# Patient Record
Sex: Male | Born: 1985 | Race: Black or African American | Hispanic: No | Marital: Single | Smoking: Former smoker
Health system: Southern US, Community
[De-identification: ages and names within clinical notes are randomized; demographics above are authoritative.]

## PROBLEM LIST (undated history)

## (undated) DIAGNOSIS — Z21 Asymptomatic human immunodeficiency virus [HIV] infection status: Secondary | ICD-10-CM

## (undated) DIAGNOSIS — L0211 Cutaneous abscess of neck: Secondary | ICD-10-CM

## (undated) DIAGNOSIS — B20 Human immunodeficiency virus [HIV] disease: Secondary | ICD-10-CM

## (undated) HISTORY — PX: HERNIA REPAIR: SHX51

---
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
## 2013-08-05 ENCOUNTER — Encounter (HOSPITAL_BASED_OUTPATIENT_CLINIC_OR_DEPARTMENT_OTHER): Payer: Self-pay | Admitting: Emergency Medicine

## 2013-08-05 ENCOUNTER — Emergency Department (HOSPITAL_BASED_OUTPATIENT_CLINIC_OR_DEPARTMENT_OTHER)
Admission: EM | Admit: 2013-08-05 | Discharge: 2013-08-05 | Disposition: A | Discharge status: Home / Self Care | Payer: Self-pay | Attending: Emergency Medicine | Admitting: Emergency Medicine

## 2013-08-05 DIAGNOSIS — Z8719 Personal history of other diseases of the digestive system: Secondary | ICD-10-CM | POA: Insufficient documentation

## 2013-08-05 DIAGNOSIS — F411 Generalized anxiety disorder: Secondary | ICD-10-CM | POA: Insufficient documentation

## 2013-08-05 DIAGNOSIS — F17200 Nicotine dependence, unspecified, uncomplicated: Secondary | ICD-10-CM | POA: Insufficient documentation

## 2013-08-05 DIAGNOSIS — F1010 Alcohol abuse, uncomplicated: Secondary | ICD-10-CM | POA: Insufficient documentation

## 2013-08-05 DIAGNOSIS — R066 Hiccough: Secondary | ICD-10-CM | POA: Insufficient documentation

## 2013-08-05 DIAGNOSIS — F10929 Alcohol use, unspecified with intoxication, unspecified: Secondary | ICD-10-CM

## 2013-08-05 DIAGNOSIS — Z880 Allergy status to penicillin: Secondary | ICD-10-CM | POA: Insufficient documentation

## 2013-08-05 LAB — ETHANOL: Alcohol, Ethyl (B): 271 mg/dL — ABNORMAL HIGH (ref 0–11)

## 2013-08-05 MED ORDER — METOCLOPRAMIDE HCL 5 MG/ML IJ SOLN
10.0000 mg | Freq: Once | INTRAMUSCULAR | Status: AC
  Administered 2013-08-05: 10 mg via INTRAVENOUS
  Filled 2013-08-05: qty 2

## 2013-08-05 MED ORDER — SODIUM CHLORIDE 0.9 % IV SOLN
25.0000 mg | Freq: Once | INTRAVENOUS | Status: DC
  Filled 2013-08-05: qty 1

## 2013-08-05 MED ORDER — SODIUM CHLORIDE 0.9 % IV BOLUS (SEPSIS)
1000.0000 mL | Freq: Once | INTRAVENOUS | Status: AC
  Administered 2013-08-05: 1000 mL via INTRAVENOUS

## 2013-08-05 NOTE — Discharge Instructions (Signed)
Alcohol Intoxication °Alcohol intoxication occurs when the amount of alcohol that a person has consumed impairs his or her ability to mentally and physically function. Alcohol directly impairs the normal chemical activity of the brain. Drinking large amounts of alcohol can lead to changes in mental function and behavior, and it can cause many physical effects that can be harmful.  °Alcohol intoxication can range in severity from mild to very severe. Various factors can affect the level of intoxication that occurs, such as the person's age, gender, weight, frequency of alcohol consumption, and the presence of other medical conditions (such as diabetes, seizures, or heart conditions). Dangerous levels of alcohol intoxication may occur when people drink large amounts of alcohol in a short period (binge drinking). Alcohol can also be especially dangerous when combined with certain prescription medicines or "recreational" drugs. °SIGNS AND SYMPTOMS °Some common signs and symptoms of mild alcohol intoxication include: °· Loss of coordination. °· Changes in mood and behavior. °· Impaired judgment. °· Slurred speech. °As alcohol intoxication progresses to more severe levels, other signs and symptoms will appear. These may include: °· Vomiting. °· Confusion and impaired memory. °· Slowed breathing. °· Seizures. °· Loss of consciousness. °DIAGNOSIS  °Your health care provider will take a medical history and perform a physical exam. You will be asked about the amount and type of alcohol you have consumed. Blood tests will be done to measure the concentration of alcohol in your blood. In many places, your blood alcohol level must be lower than 80 mg/dL (0.08%) to legally drive. However, many dangerous effects of alcohol can occur at much lower levels.  °TREATMENT  °People with alcohol intoxication often do not require treatment. Most of the effects of alcohol intoxication are temporary, and they go away as the alcohol naturally  leaves the body. Your health care provider will monitor your condition until you are stable enough to go home. Fluids are sometimes given through an IV access tube to help prevent dehydration.  °HOME CARE INSTRUCTIONS °· Do not drive after drinking alcohol. °· Stay hydrated. Drink enough water and fluids to keep your urine clear or pale yellow. Avoid caffeine.   °· Only take over-the-counter or prescription medicines as directed by your health care provider.   °SEEK MEDICAL CARE IF:  °· You have persistent vomiting.   °· You do not feel better after a few days. °· You have frequent alcohol intoxication. Your health care provider can help determine if you should see a substance use treatment counselor. °SEEK IMMEDIATE MEDICAL CARE IF:  °· You become shaky or tremble when you try to stop drinking.   °· You shake uncontrollably (seizure).   °· You throw up (vomit) blood. This may be bright red or may look like black coffee grounds.   °· You have blood in your stool. This may be bright red or may appear as a black, tarry, bad smelling stool.   °· You become lightheaded or faint.   °MAKE SURE YOU:  °· Understand these instructions. °· Will watch your condition. °· Will get help right away if you are not doing well or get worse. °Document Released: 04/18/2005 Document Revised: 03/11/2013 Document Reviewed: 12/12/2012 °[employer]® Patient Information ©2014 [employer]. ° °

## 2013-08-05 NOTE — ED Provider Notes (Signed)
CSN: [id number]     Arrival date & time 08/05/13  0156 History   First MD Initiated Contact with Patient 08/05/13 [phone]     Chief Complaint  Patient presents with  . Hiccups   (Consider location/radiation/quality/duration/timing/severity/associated sxs/prior Treatment) HPI Is a 28 year old male with a history of intractable hiccups. He is here with pickups for the past 2-3 hours. He admits to drinking alcohol earlier. The hiccups cause him to have pain in the center of his chest. He has been admitted to the hospital for hiccups in the past. He has not tried any specific remedies on this occasion. He admits he gets very anxious when he has an episode of hiccups. He describes his symptoms as moderate to severe.  History reviewed. No pertinent past medical history. Past Surgical History  Procedure Laterality Date  . Hernia repair     History reviewed. No pertinent family history. History  Substance Use Topics  . Smoking status: Current Every Day Smoker  . Smokeless tobacco: Not on file  . Alcohol Use: Yes    Review of Systems  All other systems reviewed and are negative.    Allergies  Penicillins  Home Medications  No current outpatient prescriptions on file. BP 143/85  Pulse 111  Resp 20  Ht 6' (1.829 m)  Wt 225 lb (102.059 kg)  BMI 30.51 kg/m2  SpO2 100%  Physical Exam General: Well-developed, well-nourished male in no acute distress; appearance consistent with age of record HENT: normocephalic; atraumatic; breath smells of alcohol Eyes: pupils equal, round and reactive to light; extraocular muscles intact Neck: supple Heart: regular rate and rhythm Lungs: clear to auscultation bilaterally; frequent hiccups Chest: Nontender Abdomen: soft; nondistended; nontender; bowel sounds present Extremities: No deformity; full range of motion; pulses normal Neurologic: Awake, alert and oriented; motor function intact in all extremities and symmetric; no facial droop Skin: Warm  and dry Psychiatric: Anxious    ED Course  Procedures (including critical care time)    MDM   Nursing notes and vitals signs, including pulse oximetry, reviewed.  Summary of this visit's results, reviewed by myself:  Labs:  Results for orders placed during the hospital encounter of 08/05/13 (from the past 24 hour(s))  ETHANOL     Status: Abnormal   Collection Time    08/05/13  2:16 AM      Result Value Range   Alcohol, Ethyl (B) 271 (*) 0 - 11 mg/dL   5:623:00 AM Patient sleeping comfortably without hiccups after 10 mg of Reglan IV. He was also given IV fluid bolus.    Carlisle BeersJohn L Jodie Leiner, MD 08/05/13 0300

## 2013-08-05 NOTE — ED Notes (Signed)
C/o hiccups x 2 hours, +ETOH.

## 2014-09-20 ENCOUNTER — Telehealth: Payer: Self-pay

## 2014-09-20 NOTE — Telephone Encounter (Signed)
Patient contacted regarding new intake appointment. Date and time given. Information given regarding documents needed to qualify for financial eligibility.  Dennise Raabe K Denyce Harr, RN  

## 2014-09-27 ENCOUNTER — Ambulatory Visit: Payer: Self-pay

## 2015-02-21 DIAGNOSIS — L0211 Cutaneous abscess of neck: Secondary | ICD-10-CM

## 2015-02-21 HISTORY — DX: Cutaneous abscess of neck: L02.11

## 2015-02-28 ENCOUNTER — Emergency Department (HOSPITAL_COMMUNITY): Payer: Self-pay

## 2015-02-28 ENCOUNTER — Inpatient Hospital Stay (HOSPITAL_COMMUNITY)
Admission: EM | Admit: 2015-02-28 | Discharge: 2015-03-02 | DRG: 603 | Disposition: A | Discharge status: Home / Self Care | Payer: Self-pay | Attending: Oncology | Admitting: Oncology

## 2015-02-28 ENCOUNTER — Encounter (HOSPITAL_COMMUNITY): Payer: Self-pay | Admitting: Emergency Medicine

## 2015-02-28 DIAGNOSIS — Z880 Allergy status to penicillin: Secondary | ICD-10-CM

## 2015-02-28 DIAGNOSIS — M542 Cervicalgia: Secondary | ICD-10-CM | POA: Insufficient documentation

## 2015-02-28 DIAGNOSIS — Z21 Asymptomatic human immunodeficiency virus [HIV] infection status: Secondary | ICD-10-CM | POA: Diagnosis present

## 2015-02-28 DIAGNOSIS — Z720 Tobacco use: Secondary | ICD-10-CM | POA: Diagnosis present

## 2015-02-28 DIAGNOSIS — L0211 Cutaneous abscess of neck: Principal | ICD-10-CM | POA: Diagnosis present

## 2015-02-28 DIAGNOSIS — K122 Cellulitis and abscess of mouth: Secondary | ICD-10-CM

## 2015-02-28 DIAGNOSIS — B20 Human immunodeficiency virus [HIV] disease: Secondary | ICD-10-CM | POA: Diagnosis present

## 2015-02-28 DIAGNOSIS — F17210 Nicotine dependence, cigarettes, uncomplicated: Secondary | ICD-10-CM | POA: Diagnosis present

## 2015-02-28 HISTORY — DX: Cutaneous abscess of neck: L02.11

## 2015-02-28 LAB — BASIC METABOLIC PANEL
ANION GAP: 11 (ref 5–15)
BUN: 7 mg/dL (ref 6–20)
CHLORIDE: 106 mmol/L (ref 101–111)
CO2: 23 mmol/L (ref 22–32)
Calcium: 8.4 mg/dL — ABNORMAL LOW (ref 8.9–10.3)
Creatinine, Ser: 0.81 mg/dL (ref 0.61–1.24)
GFR calc Af Amer: >60 mL/min (ref >60)
GFR calc non Af Amer: >60 mL/min (ref >60)
GLUCOSE: 86 mg/dL (ref 65–99)
POTASSIUM: 3.7 mmol/L (ref 3.5–5.1)
SODIUM: 140 mmol/L (ref 135–145)

## 2015-02-28 LAB — I-STAT CHEM 8, ED
BUN: 8 mg/dL (ref 6–20)
Calcium, Ion: 1.12 mmol/L (ref 1.12–1.23)
Chloride: 106 mmol/L (ref 101–111)
Creatinine, Ser: 1 mg/dL (ref 0.61–1.24)
Glucose, Bld: 89 mg/dL (ref 65–99)
HEMATOCRIT: 44 % (ref 39.0–52.0)
Hemoglobin: 15 g/dL (ref 13.0–17.0)
POTASSIUM: 3.8 mmol/L (ref 3.5–5.1)
SODIUM: 142 mmol/L (ref 135–145)
TCO2: 21 mmol/L (ref 0–100)

## 2015-02-28 LAB — CBC
HCT: 41.6 % (ref 39.0–52.0)
Hemoglobin: 14.3 g/dL (ref 13.0–17.0)
MCH: 32.4 pg (ref 26.0–34.0)
MCHC: 34.4 g/dL (ref 30.0–36.0)
MCV: 94.1 fL (ref 78.0–100.0)
Platelets: 217 10*3/uL (ref 150–400)
RBC: 4.42 MIL/uL (ref 4.22–5.81)
RDW: 14.5 % (ref 11.5–15.5)
WBC: 9.9 10*3/uL (ref 4.0–10.5)

## 2015-02-28 MED ORDER — ENOXAPARIN SODIUM 40 MG/0.4ML ~~LOC~~ SOLN
40.0000 mg | SUBCUTANEOUS | Status: DC
  Administered 2015-02-28 – 2015-03-02 (×3): 40 mg via SUBCUTANEOUS
  Filled 2015-02-28 (×3): qty 0.4

## 2015-02-28 MED ORDER — IOHEXOL 300 MG/ML  SOLN
75.0000 mL | Freq: Once | INTRAMUSCULAR | Status: AC | PRN
  Administered 2015-02-28: 75 mL via INTRAVENOUS

## 2015-02-28 MED ORDER — HYDROMORPHONE HCL 1 MG/ML IJ SOLN
1.0000 mg | Freq: Once | INTRAMUSCULAR | Status: AC
  Administered 2015-02-28: 1 mg via INTRAVENOUS
  Filled 2015-02-28: qty 1

## 2015-02-28 MED ORDER — MORPHINE SULFATE 2 MG/ML IJ SOLN
1.0000 mg | INTRAMUSCULAR | Status: DC | PRN
  Administered 2015-02-28 – 2015-03-02 (×9): 1 mg via INTRAVENOUS
  Filled 2015-02-28 (×8): qty 1

## 2015-02-28 MED ORDER — SODIUM CHLORIDE 0.9 % IV BOLUS (SEPSIS)
1000.0000 mL | Freq: Once | INTRAVENOUS | Status: AC
  Administered 2015-02-28: 1000 mL via INTRAVENOUS

## 2015-02-28 MED ORDER — CLINDAMYCIN PHOSPHATE 600 MG/50ML IV SOLN
600.0000 mg | Freq: Three times a day (TID) | INTRAVENOUS | Status: DC
  Administered 2015-02-28 – 2015-03-02 (×7): 600 mg via INTRAVENOUS
  Filled 2015-02-28 (×9): qty 50

## 2015-02-28 MED ORDER — CLINDAMYCIN PHOSPHATE 900 MG/50ML IV SOLN
900.0000 mg | Freq: Once | INTRAVENOUS | Status: AC
  Administered 2015-02-28: 900 mg via INTRAVENOUS
  Filled 2015-02-28: qty 50

## 2015-02-28 NOTE — ED Notes (Signed)
Left vm for ENT/Bates to call Dr. Gwendolyn Grant at [phone]

## 2015-02-28 NOTE — ED Provider Notes (Signed)
CSN: [id number]     Arrival date & time 02/28/15  0447 History   First MD Initiated Contact with Patient 02/28/15 0455     Chief Complaint  Patient presents with  . Facial Swelling     (Consider location/radiation/quality/duration/timing/severity/associated sxs/prior Treatment) Patient is a 29 y.o. male presenting with abscess. The history is provided by the patient.  Abscess Location:  Head/neck Head/neck abscess location:  L neck Size:  6 cm Abscess quality: induration and painful   Abscess quality: not weeping   Red streaking: no   Progression:  Worsening Pain details:    Quality:  Aching   Severity:  Moderate   Timing:  Constant   Progression:  Unchanged Chronicity:  New Context: not diabetes and not immunosuppression   Relieved by:  Nothing Exacerbated by: lanced it last night with a lot of pus expressed, abscess is getting bigger now. Associated symptoms: no fever     History reviewed. No pertinent past medical history. Past Surgical History  Procedure Laterality Date  . Hernia repair     History reviewed. No pertinent family history. History  Substance Use Topics  . Smoking status: Current Every Day Smoker -- 1.00 packs/day for 10 years    Types: Cigarettes  . Smokeless tobacco: Not on file  . Alcohol Use: Yes    Review of Systems  Constitutional: Negative for fever and chills.  HENT: Negative for trouble swallowing.   Respiratory: Negative for cough and shortness of breath.   All other systems reviewed and are negative.     Allergies  Penicillins  Home Medications   Prior to Admission medications   Not on File   BP 137/88 mmHg  Pulse 86  Temp(Src) 99.4 F (37.4 C) (Oral)  Resp 9  SpO2 94% Physical Exam  Constitutional: He is oriented to person, place, and time. He appears well-developed and well-nourished. No distress.  HENT:  Head: Normocephalic and atraumatic.  Mouth/Throat: No oropharyngeal exudate.  Eyes: EOM are normal. Pupils are  equal, round, and reactive to light.  Neck: Normal range of motion. Neck supple. No tracheal deviation present.  L submandibular abscess with induration. Large, extends along entire angle of the jaw. No fluctuance.  Submental space is soft.  Cardiovascular: Normal rate and regular rhythm.  Exam reveals no friction rub.   No murmur heard. Pulmonary/Chest: Effort normal and breath sounds normal. No stridor. No respiratory distress. He has no wheezes. He has no rales.  Abdominal: He exhibits no distension. There is no tenderness. There is no rebound.  Musculoskeletal: Normal range of motion. He exhibits no edema.  Lymphadenopathy:    He has cervical adenopathy.  Neurological: He is alert and oriented to person, place, and time.  Skin: He is not diaphoretic.  Nursing note and vitals reviewed.   ED Course  Procedures (including critical care time) Labs Review Labs Reviewed  BASIC METABOLIC PANEL - Abnormal; Notable for the following:    Calcium 8.4 (*)    All other components within normal limits  CBC  I-STAT CHEM 8, ED    Imaging Review Ct Soft Tissue Neck W Contrast  02/28/2015   CLINICAL DATA:  Left submandibular swelling with pus, acute onset. Headache and left-sided jaw pain. Initial encounter.  EXAM: CT NECK WITH CONTRAST  TECHNIQUE: Multidetector CT imaging of the neck was performed using the standard protocol following the bolus administration of intravenous contrast.  CONTRAST:  75mL OMNIPAQUE IOHEXOL 300 MG/ML  SOLN  COMPARISON:  None.  FINDINGS: There  is a 2.0 x 1.8 x 1.3 cm vague collection of subcutaneous fluid lateral to the left mandible, likely reflecting an evolving abscess given visualized pus. Overlying skin thickening and diffuse soft tissue edema are seen, with diffuse inflammation tracking about the left platysma. Soft tissue inflammation tracks superiorly and inferiorly along the left neck.  More mild soft tissue inflammation is noted along the right side of the neck,  and tracking under the chin. Enlarged nodes are noted inferior to the digastric muscles, measuring up to 1.5 cm in short axis. Right submandibular nodes measure up to 1.4 cm in short axis. Diffuse soft tissue edema is noted within the parapharyngeal fat planes bilaterally. Underlying mild Ludwig's angina cannot be excluded.  Pharynx and larynx: The nasopharynx, oropharynx and hypopharynx are unremarkable. The valleculae and piriform sinuses within normal limits. The proximal trachea is unremarkable in appearance. The palatine tonsils and adenoids are grossly unremarkable.  Salivary glands: The parotid and submandibular glands are symmetric and unremarkable in appearance.  Thyroid: The thyroid gland is unremarkable in appearance.  Lymph nodes: Lymphadenopathy is noted as described above. No additional enlarged cervical nodes are seen.  Vascular: The visualized vasculature is grossly unremarkable, though the vasculature is difficult to fully assess in the areas of soft tissue edema.  Limited intracranial: The visualized bones of the brain are grossly unremarkable.  Visualized orbits: The visualized portions of the orbits are within normal limits.  Mastoids and visualized paranasal sinuses: A mucus retention cyst or polyp is noted at the left maxillary sinus. Mild mucosal thickening is seen at the right maxillary sinus. There is partial opacification of the mastoid air cells bilaterally.  Skeleton: Large dental caries are seen involving the third maxillary molars bilaterally. No acute osseous abnormalities are otherwise seen. Prevertebral soft tissues are within normal limits.  Upper chest: The visualized lung apices are clear. The visualized superior mediastinum is grossly unremarkable in appearance.  IMPRESSION: 1. 2.0 x 1.8 x 1.3 cm vague collection of subcutaneous fluid lateral to the left mandible, likely reflecting an evolving abscess given visualized pus. Overlying skin thickening and diffuse soft tissue edema,  with diffuse inflammation tracking about the left platysma. 2. Diffuse soft tissue inflammation tracks about both sides of the neck, more prominent on the left, and under the chin. Enlarged nodes inferior to the digastric muscles measure up to 1.5 cm in short axis, and enlarged right submandibular nodes measure up to 1.4 cm in short axis. Diffuse soft tissue edema within the parapharyngeal fat planes bilaterally. Underlying mild Ludwig's angina cannot be excluded, given diffuse inflammation extending below the floor of the mouth. No evidence of airway compromise at this time. 3. Mucus retention cyst or polyp at the left maxillary sinus. Mild mucosal thickening at the right maxillary sinus. Partial opacification of the mastoid air cells bilaterally. 4. Large dental caries involving the third maxillary molars bilaterally.  These results were called by telephone at the time of interpretation on 02/28/2015 at 6:40 am to Dr. Elwin Mocha, who verbally acknowledged these results.   Electronically Signed   By: Roanna Raider M.D.   On: 02/28/2015 06:43     EKG Interpretation None      MDM   Final diagnoses:  Neck abscess  Neck pain    29 year old male here with a left neck infection. Plans to yesterday with large amount of pus expressed. No fevers. He is having some more and increased neck pain rating down to the chest. He denies any voice change or  trouble swallowing. No difficulty breathing. Here he is well appearing, no respiratory distress. No stridor. He has a large area of induration on his left neck underneath the mandible. Labs are normal. CT shows extensive soft tissue swelling with concern for early luck with his angina. Dr. Annalee Genta with ENT was consulted and will see the patient. Patient admitted to medicine.    Elwin Mocha, MD 03/01/15 [phone]

## 2015-02-28 NOTE — Consult Note (Signed)
ENT CONSULT:  Reason for Consult:Facial Infection Referring Physician: EDP  Seth Carroll is an 29 y.o. male.  HPI: 29 year old black male presents to the emergency department with progressive swelling in the left perimandibular facial skin. He reports a one-week history of increasing pain, erythema and swelling. No significant prior history of infection, trauma or skin lesion. He reports increasing swelling, he performed a drainage procedure for a moderate amount of purulent material with initial improvement in symptoms. Presents to the emergency department with increasing pain and erythema and swelling of the left perimandibular region.  History reviewed. No pertinent past medical history.  Past Surgical History  Procedure Laterality Date  . Hernia repair      History reviewed. No pertinent family history.  Social History:  reports that he has been smoking Cigarettes.  He has a 10 pack-year smoking history. He does not have any smokeless tobacco history on file. He reports that he drinks alcohol. He reports that he does not use illicit drugs.  Allergies:  Allergies  Allergen Reactions  . Penicillins Other (See Comments)    "Childhood per pt"    Medications: I have reviewed the patient's current medications.  Results for orders placed or performed during the hospital encounter of 02/28/15 (from the past 48 hour(s))  CBC     Status: None   Collection Time: 02/28/15  6:05 AM  Result Value Ref Range   WBC 9.9 4.0 - 10.5 K/uL   RBC 4.42 4.22 - 5.81 MIL/uL   Hemoglobin 14.3 13.0 - 17.0 g/dL   HCT 41.6 39.0 - 52.0 %   MCV 94.1 78.0 - 100.0 fL   MCH 32.4 26.0 - 34.0 pg   MCHC 34.4 30.0 - 36.0 g/dL   RDW 14.5 11.5 - 15.5 %   Platelets 217 150 - 400 K/uL  Basic metabolic panel     Status: Abnormal   Collection Time: 02/28/15  6:05 AM  Result Value Ref Range   Sodium 140 135 - 145 mmol/L   Potassium 3.7 3.5 - 5.1 mmol/L   Chloride 106 101 - 111 mmol/L   CO2 23 22 - 32 mmol/L   Glucose, Bld 86 65 - 99 mg/dL   BUN 7 6 - 20 mg/dL   Creatinine, Ser 0.81 0.61 - 1.24 mg/dL   Calcium 8.4 (L) 8.9 - 10.3 mg/dL   GFR calc non Af Amer >60 >60 mL/min   GFR calc Af Amer >60 >60 mL/min    Comment: (NOTE) The eGFR has been calculated using the CKD EPI equation. This calculation has not been validated in all clinical situations. eGFR's persistently <60 mL/min signify possible Chronic Kidney Disease.    Anion gap 11 5 - 15  I-stat chem 8, ed     Status: None   Collection Time: 02/28/15  6:11 AM  Result Value Ref Range   Sodium 142 135 - 145 mmol/L   Potassium 3.8 3.5 - 5.1 mmol/L   Chloride 106 101 - 111 mmol/L   BUN 8 6 - 20 mg/dL   Creatinine, Ser 1.00 0.61 - 1.24 mg/dL   Glucose, Bld 89 65 - 99 mg/dL   Calcium, Ion 1.12 1.12 - 1.23 mmol/L   TCO2 21 0 - 100 mmol/L   Hemoglobin 15.0 13.0 - 17.0 g/dL   HCT 44.0 39.0 - 52.0 %    Ct Soft Tissue Neck W Contrast  02/28/2015   CLINICAL DATA:  Left submandibular swelling with pus, acute onset. Headache and left-sided jaw pain. Initial  encounter.  EXAM: CT NECK WITH CONTRAST  TECHNIQUE: Multidetector CT imaging of the neck was performed using the standard protocol following the bolus administration of intravenous contrast.  CONTRAST:  38m OMNIPAQUE IOHEXOL 300 MG/ML  SOLN  COMPARISON:  None.  FINDINGS: There is a 2.0 x 1.8 x 1.3 cm vague collection of subcutaneous fluid lateral to the left mandible, likely reflecting an evolving abscess given visualized pus. Overlying skin thickening and diffuse soft tissue edema are seen, with diffuse inflammation tracking about the left platysma. Soft tissue inflammation tracks superiorly and inferiorly along the left neck.  More mild soft tissue inflammation is noted along the right side of the neck, and tracking under the chin. Enlarged nodes are noted inferior to the digastric muscles, measuring up to 1.5 cm in short axis. Right submandibular nodes measure up to 1.4 cm in short axis. Diffuse  soft tissue edema is noted within the parapharyngeal fat planes bilaterally. Underlying mild Ludwig's angina cannot be excluded.  Pharynx and larynx: The nasopharynx, oropharynx and hypopharynx are unremarkable. The valleculae and piriform sinuses within normal limits. The proximal trachea is unremarkable in appearance. The palatine tonsils and adenoids are grossly unremarkable.  Salivary glands: The parotid and submandibular glands are symmetric and unremarkable in appearance.  Thyroid: The thyroid gland is unremarkable in appearance.  Lymph nodes: Lymphadenopathy is noted as described above. No additional enlarged cervical nodes are seen.  Vascular: The visualized vasculature is grossly unremarkable, though the vasculature is difficult to fully assess in the areas of soft tissue edema.  Limited intracranial: The visualized bones of the brain are grossly unremarkable.  Visualized orbits: The visualized portions of the orbits are within normal limits.  Mastoids and visualized paranasal sinuses: A mucus retention cyst or polyp is noted at the left maxillary sinus. Mild mucosal thickening is seen at the right maxillary sinus. There is partial opacification of the mastoid air cells bilaterally.  Skeleton: Large dental caries are seen involving the third maxillary molars bilaterally. No acute osseous abnormalities are otherwise seen. Prevertebral soft tissues are within normal limits.  Upper chest: The visualized lung apices are clear. The visualized superior mediastinum is grossly unremarkable in appearance.  IMPRESSION: 1. 2.0 x 1.8 x 1.3 cm vague collection of subcutaneous fluid lateral to the left mandible, likely reflecting an evolving abscess given visualized pus. Overlying skin thickening and diffuse soft tissue edema, with diffuse inflammation tracking about the left platysma. 2. Diffuse soft tissue inflammation tracks about both sides of the neck, more prominent on the left, and under the chin. Enlarged nodes  inferior to the digastric muscles measure up to 1.5 cm in short axis, and enlarged right submandibular nodes measure up to 1.4 cm in short axis. Diffuse soft tissue edema within the parapharyngeal fat planes bilaterally. Underlying mild Ludwig's angina cannot be excluded, given diffuse inflammation extending below the floor of the mouth. No evidence of airway compromise at this time. 3. Mucus retention cyst or polyp at the left maxillary sinus. Mild mucosal thickening at the right maxillary sinus. Partial opacification of the mastoid air cells bilaterally. 4. Large dental caries involving the third maxillary molars bilaterally.  These results were called by telephone at the time of interpretation on 02/28/2015 at 6:40 am to Dr. BEvelina Bucy who verbally acknowledged these results.   Electronically Signed   By: JGarald BaldingM.D.   On: 02/28/2015 06:43    ROS:ROS  Blood pressure 120/66, pulse 80, temperature 97.3 F (36.3 C), temperature source Oral, resp. rate  9, SpO2 99 %.  PHYSICAL EXAM: General appearance - alert, well appearing, and in no distress Mouth - mucous membranes moist, pharynx normal without lesions and Normal mucosa, no evidence of dental abscess or infection. No evidence of intraoral swelling, mass or lesion. Neck - swelling and erythema involving the aspect of the left perimandibular region with erythematous changes involving the left upper neck, tender to palpation with submental lymphadenopathy.  Studies Reviewed: Neck CT Findings consistent with soft tissue skin infection involving the left perimandibular region with resolving abscess, possible minimal fluid collection and associated lymphadenopathy   Assessment/Plan: Patient presents with a 1 week history of progressive symptoms of soft tissue infection involving the left perimandibular region. Based on history and findings this is most likely consistent with a subcutaneous cyst which became infected. Patient performed simple  drainage with some initial improvement, question fluid collection versus continued abscess based on CT scan and clinical examination. Based on the patient's history and findings recommend intravenous antibody therapy (penicillin allergy?-Recommend clindamycin 600 mg IV 3 times a day), IV hydration and pain management. Expect improvement over the next 48 hours, patient be discharged with oral medications and follow-up care. If patient does not respond to appropriate medical therapy, repeat CT scan and possible surgical intervention may be required. Please reconsult if progressive symptoms of infection.  Carroll, Seth Gubser 02/28/2015, 9:01 AM

## 2015-02-28 NOTE — H&P (Signed)
Date: 02/28/2015               Patient Name:  Seth Carroll MRN: [id number]  DOB: 07-25-1985 Age / Sex: 29 y.o., male   PCP: Pcp Not In System         Medical Service: Internal Medicine Teaching Service         Attending Physician: Dr. Levert Feinstein, MD    First Contact: Dr. Ambrose Pancoast Pager: [phone]  Second Contact: Dr. Mikey Bussing Pager: [phone]       After Hours (After 5p/  First Contact Pager: [phone]  weekends / holidays): Second Contact Pager: [phone]   Chief Complaint: Left neck swelling and pain  History of Present Illness: 73 Y O M with no Significant except for tobacco Abuse presented to the Ed with C/o pain and swelling of left sided Neck pain that gradually spread to his jaw and upper chest. He says a nodule/boil gradually developed and he popped it yesterday, with drainage of a significant amount of pus, after this the pain has been getting worse. He denies prior or similar episodes. He says over the past 3 days he has been having intermittent chills, and feels  He has been having a fever but never checked his temperature. He had some headache and difficulty swallowing yesterday that has resolved, but denies difficulty breathing. He denies poor appetite, or vomiting. He denies any prior problems to the area, denies having any current problems dental problems, though has needed dental fillings in the past, sees a dentist- 2-3 times a year regularly. He denies IV drug use.   Pt lives in [place] and is presently on vacation.   Meds: No current facility-administered medications for this encounter.   Current Outpatient Prescriptions  Medication Sig Dispense Refill  . Aspirin-Salicylamide-Caffeine (BC HEADACHE PO) Take 4 each by mouth as needed (headache).      Allergies: Allergies as of 02/28/2015 - Review Complete 02/28/2015  Allergen Reaction Noted  . Penicillins Other (See Comments) 08/05/2013   History reviewed. No pertinent past medical history. Past Surgical  History  Procedure Laterality Date  . Hernia repair     History reviewed. No pertinent family history. History   Social History  . Marital Status: Single    Spouse Name: N/A  . Number of Children: N/A  . Years of Education: N/A   Occupational History  . Not on file.   Social History Main Topics  . Smoking status: Current Every Day Smoker -- 1.00 packs/day for 10 years    Types: Cigarettes  . Smokeless tobacco: Not on file  . Alcohol Use: Yes  . Drug Use: No  . Sexual Activity: Not on file   Other Topics Concern  . Not on file   Social History Narrative    Review of Systems: CONSTITUTIONAL- No  change in appetite. SKIN- No Rash, colour changes or itching. Mouth/throat- No Sorethroat, or bleeding gums. RESPIRATORY- No Cough or SOB. CARDIAC- No Palpitations, or chest pain. GI- No nausea, diarrhea, or abd pain. URINARY- No Frequency or dysuria.  Physical Exam: Blood pressure 120/66, pulse 80, temperature 99.4 F (37.4 C), temperature source Oral, resp. rate 9, SpO2 99 %. GENERAL- alert, sleepy from IV dilaudid, co-operative, appears as stated age, not in any distress. HEENT- Atraumatic, normocephalic, Pupils constricted but equal, oral mucosa appears slightly dry, mild diffuse swelling left side of neck- compared to the right, with ~2 by ~2cm nodular swelling on left mandible, where pt drained pus, slight erythema  present in area, tenderness to palpation, could not apply deep pressure to palpate Lymph nodes due to tenderness. CARDIAC- RRR, no murmurs, rubs or gallops. RESP- Moving equal volumes of air, and clear to auscultation bilaterally, no wheezes or crackles. ABDOMEN- Soft, nontender, no guarding or rebound, no palpable masses or organomegaly, bowel sounds present. BACK- Normal curvature of the spine, No tenderness along the vertebrae, no CVA tenderness. NEURO- No obvious Cr N abnormality, Gait- Normal. EXTREMITIES- pulse 2+, symmetric, no pedal edema. SKIN- few  chronic appearing hyperpigmented macules/scratches on lower extremities. PSYCH- Normal mood and affect, appropriate thought content and speech.  Lab results: Basic Metabolic Panel:  Recent Labs  52/84/13 0605 02/28/15 0611  NA 140 142  K 3.7 3.8  CL 106 106  CO2 23  --   GLUCOSE 86 89  BUN 7 8  CREATININE 0.81 1.00  CALCIUM 8.4*  --    CBC:  Recent Labs  02/28/15 0605 02/28/15 0611  WBC 9.9  --   HGB 14.3 15.0  HCT 41.6 44.0  MCV 94.1  --   PLT 217  --    Imaging results:  Ct Soft Tissue Neck W Contrast  02/28/2015   CLINICAL DATA:  Left submandibular swelling with pus, acute onset. Headache and left-sided jaw pain. Initial encounter.  EXAM: CT NECK WITH CONTRAST  TECHNIQUE: Multidetector CT imaging of the neck was performed using the standard protocol following the bolus administration of intravenous contrast.  CONTRAST:  75mL OMNIPAQUE IOHEXOL 300 MG/ML  SOLN  COMPARISON:  None.  FINDINGS: There is a 2.0 x 1.8 x 1.3 cm vague collection of subcutaneous fluid lateral to the left mandible, likely reflecting an evolving abscess given visualized pus. Overlying skin thickening and diffuse soft tissue edema are seen, with diffuse inflammation tracking about the left platysma. Soft tissue inflammation tracks superiorly and inferiorly along the left neck.  More mild soft tissue inflammation is noted along the right side of the neck, and tracking under the chin. Enlarged nodes are noted inferior to the digastric muscles, measuring up to 1.5 cm in short axis. Right submandibular nodes measure up to 1.4 cm in short axis. Diffuse soft tissue edema is noted within the parapharyngeal fat planes bilaterally. Underlying mild Ludwig's angina cannot be excluded.  Pharynx and larynx: The nasopharynx, oropharynx and hypopharynx are unremarkable. The valleculae and piriform sinuses within normal limits. The proximal trachea is unremarkable in appearance. The palatine tonsils and adenoids are grossly  unremarkable.  Salivary glands: The parotid and submandibular glands are symmetric and unremarkable in appearance.  Thyroid: The thyroid gland is unremarkable in appearance.  Lymph nodes: Lymphadenopathy is noted as described above. No additional enlarged cervical nodes are seen.  Vascular: The visualized vasculature is grossly unremarkable, though the vasculature is difficult to fully assess in the areas of soft tissue edema.  Limited intracranial: The visualized bones of the brain are grossly unremarkable.  Visualized orbits: The visualized portions of the orbits are within normal limits.  Mastoids and visualized paranasal sinuses: A mucus retention cyst or polyp is noted at the left maxillary sinus. Mild mucosal thickening is seen at the right maxillary sinus. There is partial opacification of the mastoid air cells bilaterally.  Skeleton: Large dental caries are seen involving the third maxillary molars bilaterally. No acute osseous abnormalities are otherwise seen. Prevertebral soft tissues are within normal limits.  Upper chest: The visualized lung apices are clear. The visualized superior mediastinum is grossly unremarkable in appearance.  IMPRESSION: 1. 2.0 x  1.8 x 1.3 cm vague collection of subcutaneous fluid lateral to the left mandible, likely reflecting an evolving abscess given visualized pus. Overlying skin thickening and diffuse soft tissue edema, with diffuse inflammation tracking about the left platysma. 2. Diffuse soft tissue inflammation tracks about both sides of the neck, more prominent on the left, and under the chin. Enlarged nodes inferior to the digastric muscles measure up to 1.5 cm in short axis, and enlarged right submandibular nodes measure up to 1.4 cm in short axis. Diffuse soft tissue edema within the parapharyngeal fat planes bilaterally. Underlying mild Ludwig's angina cannot be excluded, given diffuse inflammation extending below the floor of the mouth. No evidence of airway  compromise at this time. 3. Mucus retention cyst or polyp at the left maxillary sinus. Mild mucosal thickening at the right maxillary sinus. Partial opacification of the mastoid air cells bilaterally. 4. Large dental caries involving the third maxillary molars bilaterally.  These results were called by telephone at the time of interpretation on 02/28/2015 at 6:40 am to Dr. Elwin Mocha, who verbally acknowledged these results.   Electronically Signed   By: Roanna Raider M.D.   On: 02/28/2015 06:43   Other results: EKG: None  Assessment & Plan by Problem: Active Problems:   Neck abscess  Neck Abscess- Swelling, Pain, with subjective fevers- Tmax here- 99.4. CT scan with findings suggestive of evolving abscess, with soft tissue edema, likely cellulitis. ENT consulted in the Ed- Per evaluation- likely subcutaneous Cyst that became infected, cont Antibiotics, if no imrovement in 48Hrs then Repaet Ct scan and possible Surgical intervention. Suggestion of Lugwigs angina from Ct scan report but no clinical features to suport this as yet- mouth pain, stiff neck, drooling, and dysphagia, muffled voice or difficulty speaking. - Admit to med-surg - Regular diet - CT scan without obvious abscess for now, will treat with broad spectrum antibiotics- IV clindamycin, appears to have gotten 2 doses of 900mg  IV in the ED about 1 hr apart, cont At 600mg  TID IV. Pt has penicillin allergy, does not know his reaction, but present since he was a child. - Order Blood cultures- not gotten prior to Antibiotics - HIV - Blood glucose- Fasting 86, will not get HgbA1c - IV morphine 1 mg Q4H for pain.  DVT ppx- Lovenox.   Dispo: Disposition is deferred at this time, awaiting improvement of current medical problems.   The patient does not have a current PCP (Pcp Not In System) and does need an [facility] hospital follow-up appointment after discharge.  The patient does not know have transportation limitations that hinder  transportation to clinic appointments.  Signed: Onnie Boer, MD 02/28/2015, 8:34 AM

## 2015-02-28 NOTE — Care Management Note (Signed)
Case Management Note  Patient Details  Name: Joban Colledge MRN: [id number] Date of Birth: July 20, 1986  Subjective/Objective:                    Action/Plan:  UR updated  Expected Discharge Date:                  Expected Discharge Plan:  Home/Self Care  In-House Referral:     Discharge planning Services     Post Acute Care Choice:    Choice offered to:     DME Arranged:    DME Agency:     HH Arranged:    HH Agency:     Status of Service:  In process, will continue to follow  Medicare Important Message Given:    Date Medicare IM Given:    Medicare IM give by:    Date Additional Medicare IM Given:    Additional Medicare Important Message give by:     If discussed at Long Length of Stay Meetings, dates discussed:    Additional Comments:  Kingsley Plan, RN 02/28/2015, 12:33 PM

## 2015-03-01 ENCOUNTER — Encounter (HOSPITAL_COMMUNITY): Payer: Self-pay | Admitting: General Practice

## 2015-03-01 LAB — HIV 1/2 AB DIFFERENTIATION
HIV 1 AB: POSITIVE — AB
HIV 2 Ab: NEGATIVE

## 2015-03-01 LAB — HIV ANTIBODY (ROUTINE TESTING W REFLEX)

## 2015-03-01 NOTE — Progress Notes (Addendum)
Subjective: No acute events overnight. Patient reports feeling better and reports less pain.  On IV clindamycin Denies difficulty swallowing or breathing. Objective: Vital signs in last 24 hours: Filed Vitals:   02/28/15 0917 02/28/15 1425 02/28/15 2129 03/01/15 0505  BP: 133/79 130/66 123/65   Pulse: 80 80 66 76  Temp: 98.1 F (36.7 C) 98.3 F (36.8 C) 98.6 F (37 C) 98.2 F (36.8 C)  TempSrc: Oral Oral Oral Oral  Resp: 20 20 18 18   Weight: 190 lb (86.183 kg)     SpO2: 98% 97% 99% 100%   Weight change:   Intake/Output Summary (Last 24 hours) at 03/01/15 1105 Last data filed at 03/01/15 0853  Gross per 24 hour  Intake   1130 ml  Output      0 ml  Net   1130 ml   GENERAL- alert co-operative, appears as stated age, not in any distress. HEENT- Atraumatic, normocephalic,  Left side of neck- compared to the right, ~2 by ~2cm nodular swelling on left mandible, where pt drained pus, slight erythema present in area, tenderness to palpation, could not apply deep pressure to palpate Lymph nodes due to tenderness. Overnight- there was some drainage noted on the gown- serosanginuous  CARDIAC- RRR, no murmurs, rubs or gallops. RESP- Moving equal volumes of air, and clear to auscultation bilaterally, no wheezes or crackles. ABDOMEN- Soft, nontender, no guarding or rebound, no palpable masses or organomegaly, bowel sounds present. EXTREMITIES- pulse 2+, symmetric, no pedal edema. SKIN- few chronic appearing hyperpigmented macules/scratches on lower extremities.  Lab Results: @LABTEST2 @ CBC Latest Ref Rng 02/28/2015 02/28/2015  WBC 4.0 - 10.5 K/uL - 9.9  Hemoglobin 13.0 - 17.0 g/dL 16.1 09.6  Hematocrit 04.5 - 52.0 % 44.0 41.6  Platelets 150 - 400 K/uL - 217    BMP Latest Ref Rng 02/28/2015 02/28/2015  Glucose 65 - 99 mg/dL 89 86  BUN 6 - 20 mg/dL 8 7  Creatinine 4.09 - 1.24 mg/dL 8.11 9.14  Sodium 782 - 145 mmol/L 142 140  Potassium 3.5 - 5.1 mmol/L 3.8 3.7  Chloride 101 - 111  mmol/L 106 106  CO2 22 - 32 mmol/L - 23  Calcium 8.9 - 10.3 mg/dL - 9.5(A)     Micro Results: Recent Results (from the past 240 hour(s))  Culture, blood (routine x 2)     Status: None (Preliminary result)   Collection Time: 02/28/15 12:00 PM  Result Value Ref Range Status   Specimen Description BLOOD RIGHT ARM  Final   Special Requests BOTTLES DRAWN AEROBIC AND ANAEROBIC 10CC  Final   Culture PENDING  Incomplete   Report Status PENDING  Incomplete  Culture, blood (routine x 2)     Status: None (Preliminary result)   Collection Time: 02/28/15 12:08 PM  Result Value Ref Range Status   Specimen Description BLOOD RIGHT ARM  Final   Special Requests BOTTLES DRAWN AEROBIC AND ANAEROBIC 10CC  Final   Culture PENDING  Incomplete   Report Status PENDING  Incomplete   Studies/Results: Ct Soft Tissue Neck W Contrast  02/28/2015   CLINICAL DATA:  Left submandibular swelling with pus, acute onset. Headache and left-sided jaw pain. Initial encounter.  EXAM: CT NECK WITH CONTRAST  TECHNIQUE: Multidetector CT imaging of the neck was performed using the standard protocol following the bolus administration of intravenous contrast.  CONTRAST:  75mL OMNIPAQUE IOHEXOL 300 MG/ML  SOLN  COMPARISON:  None.  FINDINGS: There is a 2.0 x 1.8 x 1.3 cm vague  collection of subcutaneous fluid lateral to the left mandible, likely reflecting an evolving abscess given visualized pus. Overlying skin thickening and diffuse soft tissue edema are seen, with diffuse inflammation tracking about the left platysma. Soft tissue inflammation tracks superiorly and inferiorly along the left neck.  More mild soft tissue inflammation is noted along the right side of the neck, and tracking under the chin. Enlarged nodes are noted inferior to the digastric muscles, measuring up to 1.5 cm in short axis. Right submandibular nodes measure up to 1.4 cm in short axis. Diffuse soft tissue edema is noted within the parapharyngeal fat planes  bilaterally. Underlying mild Ludwig's angina cannot be excluded.  Pharynx and larynx: The nasopharynx, oropharynx and hypopharynx are unremarkable. The valleculae and piriform sinuses within normal limits. The proximal trachea is unremarkable in appearance. The palatine tonsils and adenoids are grossly unremarkable.  Salivary glands: The parotid and submandibular glands are symmetric and unremarkable in appearance.  Thyroid: The thyroid gland is unremarkable in appearance.  Lymph nodes: Lymphadenopathy is noted as described above. No additional enlarged cervical nodes are seen.  Vascular: The visualized vasculature is grossly unremarkable, though the vasculature is difficult to fully assess in the areas of soft tissue edema.  Limited intracranial: The visualized bones of the brain are grossly unremarkable.  Visualized orbits: The visualized portions of the orbits are within normal limits.  Mastoids and visualized paranasal sinuses: A mucus retention cyst or polyp is noted at the left maxillary sinus. Mild mucosal thickening is seen at the right maxillary sinus. There is partial opacification of the mastoid air cells bilaterally.  Skeleton: Large dental caries are seen involving the third maxillary molars bilaterally. No acute osseous abnormalities are otherwise seen. Prevertebral soft tissues are within normal limits.  Upper chest: The visualized lung apices are clear. The visualized superior mediastinum is grossly unremarkable in appearance.  IMPRESSION: 1. 2.0 x 1.8 x 1.3 cm vague collection of subcutaneous fluid lateral to the left mandible, likely reflecting an evolving abscess given visualized pus. Overlying skin thickening and diffuse soft tissue edema, with diffuse inflammation tracking about the left platysma. 2. Diffuse soft tissue inflammation tracks about both sides of the neck, more prominent on the left, and under the chin. Enlarged nodes inferior to the digastric muscles measure up to 1.5 cm in short  axis, and enlarged right submandibular nodes measure up to 1.4 cm in short axis. Diffuse soft tissue edema within the parapharyngeal fat planes bilaterally. Underlying mild Ludwig's angina cannot be excluded, given diffuse inflammation extending below the floor of the mouth. No evidence of airway compromise at this time. 3. Mucus retention cyst or polyp at the left maxillary sinus. Mild mucosal thickening at the right maxillary sinus. Partial opacification of the mastoid air cells bilaterally. 4. Large dental caries involving the third maxillary molars bilaterally.  These results were called by telephone at the time of interpretation on 02/28/2015 at 6:40 am to Dr. Elwin Mocha, who verbally acknowledged these results.   Electronically Signed   By: Roanna Raider M.D.   On: 02/28/2015 06:43   Medications: I have reviewed the patient's current medications. Scheduled Meds: . clindamycin (CLEOCIN) IV  600 mg Intravenous 3 times per day  . enoxaparin (LOVENOX) injection  40 mg Subcutaneous Q24H   Continuous Infusions:  PRN Meds:.morphine injection Assessment/Plan: Active Problems:   Neck abscess Neck Abscess- Swelling, Pain, with subjective fevers- Tmax here- 99.4. CT scan with findings suggestive of evolving abscess, with soft tissue edema, likely cellulitis. ENT  consulted in the Ed- Per evaluation- likely subcutaneous Cyst that became infected, cont Antibiotics, - Swelling was about the same as last time. -continue 600 mg IV clindamycin TID for 48 hours from the start then can d/c patient with PO clindamycin - Regular diet - CT scan without obvious abscess for now, will treat with broad spectrum antibiotics- IV clindamycin, appears to have gotten 2 doses of  IV in the ED about 1 hr apart, cont At  TID IV. Pt has penicillin allergy, does not know his reaction, but present since he was a child. - F/u blood cultures - HIV f/u - Blood glucose- Fasting 86, will not get HgbA1c - IV morphine 1 mg  Q4H for pain.  DVT ppx- Lovenox.   Dispo: Disposition is deferred at this time, awaiting improvement of current medical problems.  Anticipated discharge in approximately 1 day(s).   The patient does not have a current PCP (Pcp Not In System) and does not need an [facility] hospital follow-up appointment after discharge.  The patient does not have transportation limitations that hinder transportation to clinic appointments.  .Services Needed at time of discharge: Y = Yes, Blank = No PT:   OT:   RN:   Equipment:   Other:     LOS: 1 day   Deneise Lever, MD 03/01/2015, 11:05 AM

## 2015-03-01 NOTE — Progress Notes (Addendum)
         [facility]   I was alerted to patients positive HIV test via [facility] system  I will order labs that are needed and see the patient formally in the morning  He does not appear to have insurance and will need to be enrolled into [place], ADAP  We can potentially start pt on ARV via [employer] assistance or via [employer] within a few days (but for latter [employer]) he would need to physically come to our clinic

## 2015-03-02 ENCOUNTER — Encounter (HOSPITAL_COMMUNITY): Payer: Self-pay | Admitting: Internal Medicine

## 2015-03-02 DIAGNOSIS — L0211 Cutaneous abscess of neck: Principal | ICD-10-CM

## 2015-03-02 DIAGNOSIS — Z21 Asymptomatic human immunodeficiency virus [HIV] infection status: Secondary | ICD-10-CM

## 2015-03-02 DIAGNOSIS — M542 Cervicalgia: Secondary | ICD-10-CM | POA: Insufficient documentation

## 2015-03-02 DIAGNOSIS — B9689 Other specified bacterial agents as the cause of diseases classified elsewhere: Secondary | ICD-10-CM

## 2015-03-02 DIAGNOSIS — B20 Human immunodeficiency virus [HIV] disease: Secondary | ICD-10-CM | POA: Diagnosis present

## 2015-03-02 DIAGNOSIS — Z720 Tobacco use: Secondary | ICD-10-CM

## 2015-03-02 MED ORDER — CLINDAMYCIN HCL 300 MG PO CAPS: 600.0000 mg | ORAL_CAPSULE | Freq: Four times a day (QID) | ORAL | Status: AC

## 2015-03-02 MED ORDER — CLINDAMYCIN HCL 300 MG PO CAPS: 600.0000 mg | ORAL_CAPSULE | Freq: Four times a day (QID) | ORAL | Status: DC

## 2015-03-02 NOTE — Progress Notes (Signed)
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 Informed that he will need to be on antiretrovirals.   Pt says that he has been sexually active with 3-4 women over the last year. He has had HIV test last year which was negative but did not seem surprised when told this time.  Objective: Vital signs in last 24 hours: Filed Vitals:   03/01/15 1505 03/01/15 2200 03/02/15 0204 03/02/15 0621  BP: 125/66 125/86 123/75 113/68  Pulse: 66 58 64 66  Temp: 98.8 F (37.1 C) 98.1 F (36.7 C) 98.6 F (37 C) 97.8 F (36.6 C)  TempSrc: Oral Oral Oral Oral  Resp: Weight:      SpO2: 100% 100% 99% 97%   Weight change:   Intake/Output Summary (Last 24 hours) at 03/02/15 1300 Last data filed at 03/02/15 0901  Gross per 24 hour  Intake    720 ml  Output      0 ml  Net    720 ml   GENERAL- alert co-operative, appears as stated age, not in any distress. HEENT- Atraumatic, normocephalic,  Left side of neck- compared to the right, ~2 by ~2cm nodular swelling on left mandible, where pt drained pus, slight erythema present in area, tenderness to palpation, dressing present,  Overnight- there was some drainage noted on the gown- serosanginuous  CARDIAC- RRR, no murmurs, rubs or gallops. RESP- Moving equal volumes of air, and clear to auscultation bilaterally, no wheezes or crackles. ABDOMEN- Soft, nontender, no guarding or rebound, no palpable masses or organomegaly, bowel sounds present. EXTREMITIES- pulse 2+, symmetric, no pedal edema. SKIN- few chronic appearing hyperpigmented macules/scratches on lower extremities.  Lab Results: @ No results found for:  HIV1RNAQUANT   Micro Results: Recent Results (from the past 240 hour(s))  Culture, blood (routine x 2)     Status: None (Preliminary result)   Collection Time: 02/28/15 12:00 PM  Result Value Ref Range Status   Specimen Description BLOOD RIGHT ARM  Final   Special Requests BOTTLES DRAWN AEROBIC AND ANAEROBIC 10CC  Final   Culture NO GROWTH 1 DAY  Final   Report Status PENDING  Incomplete  Culture, blood (routine x 2)     Status: None (Preliminary result)   Collection Time: 02/28/15 12:08 PM  Result Value Ref Range Status   Specimen Description BLOOD RIGHT ARM  Final   Special Requests BOTTLES DRAWN AEROBIC AND ANAEROBIC 10CC  Final   Culture NO GROWTH 1 DAY  Final   Report Status PENDING  Incomplete   Studies/Results: No results found. Medications: I have reviewed the patient's current medications. Scheduled Meds: . clindamycin (CLEOCIN) IV  600 mg Intravenous 3 times per day  . enoxaparin (LOVENOX) injection  40 mg Subcutaneous Q24H   Continuous Infusions:  PRN Meds:.morphine injection Assessment/Plan: Active Problems:   Neck abscess Neck Abscess- Swelling, Pain, with subjective fevers- Tmax here- 99.4. CT scan with findings suggestive of evolving abscess, with soft tissue edema, likely cellulitis. 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 Informed that he will need to be on antiretrovirals.   Pt says that he has been sexually active with 3-4 women over the last year. He has had HIV test  last year which was negative but did not seem surprised when told this time.    DVT ppx- Lovenox.    Dispo: Disposition is deferred at this time, awaiting improvement of current medical problems.  Anticipated discharge in approximately 0 day(s).   The patient does not have a current PCP (Pcp Not In System) and does need an [facility] hospital follow-up appointment after discharge.  The patient does not have transportation limitations that hinder transportation to clinic appointments.  .Services Needed at time of discharge: Y = Yes, Blank = No PT:   OT:   RN:   Equipment:   Other:     LOS: 2 days   Deneise Lever, MD 03/02/2015, 1:00 PM

## 2015-03-02 NOTE — Discharge Summary (Signed)
Name: Seth Carroll MRN: [id number] DOB: [birth year] 28 y.o. PCP: Pcp Not In System  Date of Admission: 02/28/2015  4:52 AM Date of Discharge: 03/02/2015 Attending Physician: Levert Feinstein, MD  Discharge Diagnosis: 1. Abscess of neck and newly diagnosed HIV Active Problems:   Neck abscess   HIV disease  Discharge Medications:   Medication List    STOP taking these medications        BC HEADACHE PO      TAKE these medications        clindamycin 300 MG capsule  Commonly known as:  CLEOCIN  Take 2 capsules (600 mg total) by mouth 4 (four) times daily.        Disposition and follow-up:   Seth Carroll was discharged from [facility] in Stable condition.  At the hospital follow up visit please address:  1.  Please follow up regarding neck abscess- has it resolved?    Please follow up regarding initiation of HIV meds- newly diagnosed HIVpositive- some of the tests are pending- see the hospital course  2.  Labs / imaging needed at time of follow-up: CBC  3.  Pending labs/ test needing follow-up: HIV tests, RPR, GC  Follow-up Appointments: Pt scheduled at [facility]    Discharge Instructions:     Discharge Instructions    Call MD for:  difficulty breathing, headache or visual disturbances    Complete by:  As directed      Call MD for:  persistant nausea and vomiting    Complete by:  As directed      Call MD for:  redness, tenderness, or signs of infection (pain, swelling, redness, odor or green/yellow discharge around incision site)    Complete by:  As directed      Call MD for:  severe uncontrolled pain    Complete by:  As directed      Call MD for:  temperature >100.4    Complete by:  As directed      Change dressing (specify)    Complete by:  As directed   Change dressing few times a day until it stops draining     Discharge instructions    Complete by:  As directed   Please take clindamycin antibiotic 2 pills four times a day for  10 days Please go to hospital if you experience difficulty swallowing, or breathing, or run any fevers or other unusual symptoms     Increase activity slowly    Complete by:  As directed            Consultations:  ENT, ID  Procedures Performed:  Ct Soft Tissue Neck W Contrast  02/28/2015   CLINICAL DATA:  Left submandibular swelling with pus, acute onset. Headache and left-sided jaw pain. Initial encounter.  EXAM: CT NECK WITH CONTRAST  TECHNIQUE: Multidetector CT imaging of the neck was performed using the standard protocol following the bolus administration of intravenous contrast.  CONTRAST:  75mL OMNIPAQUE IOHEXOL 300 MG/ML  SOLN  COMPARISON:  None.  FINDINGS: There is a 2.0 x 1.8 x 1.3 cm vague collection of subcutaneous fluid lateral to the left mandible, likely reflecting an evolving abscess given visualized pus. Overlying skin thickening and diffuse soft tissue edema are seen, with diffuse inflammation tracking about the left platysma. Soft tissue inflammation tracks superiorly and inferiorly along the left neck.  More mild soft tissue inflammation is noted along the right side of the neck, and tracking under the chin. Enlarged  nodes are noted inferior to the digastric muscles, measuring up to 1.5 cm in short axis. Right submandibular nodes measure up to 1.4 cm in short axis. Diffuse soft tissue edema is noted within the parapharyngeal fat planes bilaterally. Underlying mild Ludwig's angina cannot be excluded.  Pharynx and larynx: The nasopharynx, oropharynx and hypopharynx are unremarkable. The valleculae and piriform sinuses within normal limits. The proximal trachea is unremarkable in appearance. The palatine tonsils and adenoids are grossly unremarkable.  Salivary glands: The parotid and submandibular glands are symmetric and unremarkable in appearance.  Thyroid: The thyroid gland is unremarkable in appearance.  Lymph nodes: Lymphadenopathy is noted as described above. No additional enlarged  cervical nodes are seen.  Vascular: The visualized vasculature is grossly unremarkable, though the vasculature is difficult to fully assess in the areas of soft tissue edema.  Limited intracranial: The visualized bones of the brain are grossly unremarkable.  Visualized orbits: The visualized portions of the orbits are within normal limits.  Mastoids and visualized paranasal sinuses: A mucus retention cyst or polyp is noted at the left maxillary sinus. Mild mucosal thickening is seen at the right maxillary sinus. There is partial opacification of the mastoid air cells bilaterally.  Skeleton: Large dental caries are seen involving the third maxillary molars bilaterally. No acute osseous abnormalities are otherwise seen. Prevertebral soft tissues are within normal limits.  Upper chest: The visualized lung apices are clear. The visualized superior mediastinum is grossly unremarkable in appearance.  IMPRESSION: 1. 2.0 x 1.8 x 1.3 cm vague collection of subcutaneous fluid lateral to the left mandible, likely reflecting an evolving abscess given visualized pus. Overlying skin thickening and diffuse soft tissue edema, with diffuse inflammation tracking about the left platysma. 2. Diffuse soft tissue inflammation tracks about both sides of the neck, more prominent on the left, and under the chin. Enlarged nodes inferior to the digastric muscles measure up to 1.5 cm in short axis, and enlarged right submandibular nodes measure up to 1.4 cm in short axis. Diffuse soft tissue edema within the parapharyngeal fat planes bilaterally. Underlying mild Ludwig's angina cannot be excluded, given diffuse inflammation extending below the floor of the mouth. No evidence of airway compromise at this time. 3. Mucus retention cyst or polyp at the left maxillary sinus. Mild mucosal thickening at the right maxillary sinus. Partial opacification of the mastoid air cells bilaterally. 4. Large dental caries involving the third maxillary molars  bilaterally.  These results were called by telephone at the time of interpretation on 02/28/2015 at 6:40 am to Dr. Elwin Mocha, who verbally acknowledged these results.   Electronically Signed   By: Roanna Raider M.D.   On: 02/28/2015 06:43    2D Echo:   Cardiac Cath:   Admission HPI:  Seth Carroll is a 89 Y O M with no Significant history except for tobacco Abuse who presented to the Ed with left sided Neck pain And swelling of 3 days of duration that gradually spread to his jaw and upper chest. He says a nodule/boil gradually developed, and he squeezed it with drainage of a significant amount of pus, after this the pain has been getting worse. He denies prior or similar episodes. He says over the past 3 days he has been having intermittent chills, and feels He has been having a fever but never checked his temperature. He had some headache and difficulty swallowing yesterday that has resolved, but denies difficulty breathing. He denies poor appetite, or vomiting. He denies any prior problems to  the area, denies having any current problems dental problems, though has needed dental fillings in the past, sees a dentist- 2-3 times a year regularly. He denies IV drug use.   Pt lives in [place] was traveling down to [place] until he stopped by in [place] where some of his family lives.   Due to the presence of significant swelling and drainage of the neck, ENT was consulted and a CT was done- please see the hospital course. He was started on IV clindamycin.  Blood cultures were collected prior to initiation of clindamycin. CBC and BMET was unremarkable.  Hospital Course by problem list: Active Problems:   Neck abscess   HIV disease   1. Neck abscess After presenting to the ER, he was seen by ENT and they said that it is most likely consistent with a subcutaneous cyst which became infected. CT of soft tissue neck with contrast showed soft tissue skin infection involving the left perimandibular  region with resolving abscess, possible minimal fluid collection and associated lymphadenopathy.  They started intravenous clindamycin 600 mg IV 3 times a day, IV hydration and pain management. The clindamycin and the pain management was continued over the next 48 hours, though patient needed minimal pain control. His wound continued to drain. He remained afebrile during the entire course and showed no systemic symptoms. His swelling and pain improved and he was discharged home with 600 mg clindamycin 4 times a day and a 3 week course was prescribed and sent to his choice of pharmacy. Also, patient was educated about wound care and the necessity of a follow up once he gets back to [place] and was advised the possible complications if proper follow up care was not be done.   2. HIV: Patient was incidentally diagnosed with HIV. He mentioned to [place] that he was sexually active with 3-4 women over the last year. He had HIV test done last year which was negative at that time. Education regarding HIV was given. Dr. Daiva Eves saw the patient, and we informed the patient regarding the positive test and that follow up tests are needed and informed him that occasionally there may be a false positive test. Follow up HIV tests were done including confirmatory tests, viral load, and CD4 count, which are still pending. His Hepatitis panel was unremarkable and RPR is still pending.  Since the patient is not a  resident, and was a self-pay patient antiretrovirals were not given as the costs would be prohibitive for the patient and so We tried our best to contact [employer] where he lives but were unable to contact them, so we gave the address and phone to patient [facility] Patient told me he was able to make an appointment this Tuesday and will follow up there. Info was also given regarding our [facility] for Infectious Disease should the patient have any questions.     Discharge Vitals:   BP 113/68 mmHg  Pulse 66  Temp(Src) 97.8 F (36.6 C) (Oral)  Resp 18  Wt 190 lb (86.183 kg)  SpO2 97%  Discharge Labs:  No results found for this or any previous visit (from the past 24 hour(s)).  Signed: Deneise Lever, MD 03/02/2015, 1:33 PM    Services Ordered on Discharge:  Equipment Ordered on Discharge:

## 2015-03-02 NOTE — Care Management Note (Signed)
Case Management Note  Patient Details  Name: Seth Carroll MRN: [id number] Date of Birth: 10-Jul-1986  Subjective/Objective:                    Action/Plan: MATCH letter given to patient and explained , Clindamycin will be filled for $3 . Patient voiced understanding   Expected Discharge Date:                  Expected Discharge Plan:  Home/Self Care  In-House Referral:     Discharge planning Services  Medication Assistance, MATCH Program  Post Acute Care Choice:    Choice offered to:     DME Arranged:    DME Agency:     HH Arranged:    HH Agency:     Status of Service:  In process, will continue to follow  Medicare Important Message Given:    Date Medicare IM Given:    Medicare IM give by:    Date Additional Medicare IM Given:    Additional Medicare Important Message give by:     If discussed at Long Length of Stay Meetings, dates discussed:    Additional Comments:  Kingsley Plan, RN 03/02/2015, 2:20 PM

## 2015-03-02 NOTE — Progress Notes (Signed)
Patient ID: Seth Carroll, male   DOB: April 15, 1986, 29 y.o.   MRN: [id number] Medicine attending discharge note: I personally examined this patient on the day of discharge together with resident physicians Dr. Carlynn Purl and Marjory Sneddon and I attest to the accuracy of their evaluation and discharge plan which we discussed together.  Clinical summary: Healthy 29 year old man who has been traveling from his home in [place] down the [address] to visit friends and family. He developed a painful swelling in his left mandibular area which came to ahead and began to drain purulent material the day prior to his presentation to our emergency department. He was having self-reported fevers and chills. He did not take his temperature. He reports no respiratory distress. A CT scan of the neck was done which showed soft tissue swelling and an approximate 2 cm area of resolving abscess with some associated lymphadenopathy. He was evaluated in the emergency department by ear nose and throat surgery. No immediate indication for incision and drainage. Parenteral antibiotics were recommended. Admission to the medical service requested for antibiotic treatment.  Hospital course: He was treated with 48 hours of parenteral clindamycin and then stable to transition to oral clindamycin. He still had some low level purulent drainage from the left neck abscess but much of the pain and swelling resolved. He was afebrile for the entire hospital course except for admission low grade temperature 99.4 degrees.  He was screened for HIV which returned positive. Probable diagnosis discussed with the patient by resident physicians Dr. Carlynn Purl and Marjory Sneddon and a confirmatory testing will be done. Infectious disease consultation was obtained. Patient is on his way back home to [place]. We will make arrangements for follow-up there. He states that he does have primary care physicians in [place].  Disposition: Condition stable at time of discharge. He will complete a course of oral clindamycin. Follow-up for newly diagnosed HIV positivity will be arranged. There were no complications.

## 2015-03-02 NOTE — Care Management Note (Signed)
Case Management Note  Patient Details  Name: Seth Carroll MRN: [id number] Date of Birth: 1985/10/26  Subjective/Objective:                    Action/Plan: Spoke to Mitch at [facility] [phone] , cell [phone] , for patient to quality for ADAP he has to be [place] resident , being followed in clinic ,  results of labs ( viral load ) needed . ADAP can take up to 45 days .  Will speak to patient today after ID MD has seen .   Expected Discharge Date:                  Expected Discharge Plan:  Home/Self Care  In-House Referral:     Discharge planning Services  Medication Assistance  Post Acute Care Choice:    Choice offered to:     DME Arranged:    DME Agency:     HH Arranged:    HH Agency:     Status of Service:  In process, will continue to follow  Medicare Important Message Given:    Date Medicare IM Given:    Medicare IM give by:    Date Additional Medicare IM Given:    Additional Medicare Important Message give by:     If discussed at Long Length of Stay Meetings, dates discussed:    Additional Comments:  Kingsley Plan, RN 03/02/2015, 10:02 AM

## 2015-03-02 NOTE — Progress Notes (Signed)
AVS  Discharge instructions were reviewed with patient. Patient was told he a prescription to pick up at his pharmacy. Patient stated that he did not have any questions. Will assist to his transportation when by is ready.

## 2015-03-02 NOTE — Discharge Instructions (Addendum)
Please take clindamycin antibiotic 2 pills four times a day for 10 days Please go to hospital if you experience difficulty swallowing, or breathing, or run any fevers or other unusual symptoms   Upon return, please go to below address to start your medicines for HIV [facility] [address] [place], [place] [address] Tel: [phone]    Abscess An abscess is an infected area that contains a collection of pus and debris.It can occur in almost any part of the body. An abscess is also known as a furuncle or boil. CAUSES  An abscess occurs when tissue gets infected. This can occur from blockage of oil or sweat glands, infection of hair follicles, or a minor injury to the skin. As the body tries to fight the infection, pus collects in the area and creates pressure under the skin. This pressure causes pain. People with weakened immune systems have difficulty fighting infections and get certain abscesses more often.  SYMPTOMS Usually an abscess develops on the skin and becomes a painful mass that is red, warm, and tender. If the abscess forms under the skin, you may feel a moveable soft area under the skin. Some abscesses break open (rupture) on their own, but most will continue to get worse without care. The infection can spread deeper into the body and eventually into the bloodstream, causing you to feel ill.  DIAGNOSIS  Your caregiver will take your medical history and perform a physical exam. A sample of fluid may also be taken from the abscess to determine what is causing your infection. TREATMENT  Your caregiver may prescribe antibiotic medicines to fight the infection. However, taking antibiotics alone usually does not cure an abscess. Your caregiver may need to make a small cut (incision) in the abscess to drain the pus. In some cases, gauze is packed into the abscess to reduce pain and to continue draining the area. HOME CARE INSTRUCTIONS   Only  take over-the-counter or prescription medicines for pain, discomfort, or fever as directed by your caregiver.  If you were prescribed antibiotics, take them as directed. Finish them even if you start to feel better.  If gauze is used, follow your caregiver's directions for changing the gauze.  To avoid spreading the infection:  Keep your draining abscess covered with a bandage.  Wash your hands well.  Do not share personal care items, towels, or whirlpools with others.  Avoid skin contact with others.  Keep your skin and clothes clean around the abscess.  Keep all follow-up appointments as directed by your caregiver. SEEK MEDICAL CARE IF:   You have increased pain, swelling, redness, fluid drainage, or bleeding.  You have muscle aches, chills, or a general ill feeling.  You have a fever. MAKE SURE YOU:   Understand these instructions.  Will watch your condition.  Will get help right away if you are not doing well or get worse. Document Released: 04/18/2005 Document Revised: 01/08/2012 Document Reviewed: 09/21/2011 [facility] Patient Information 2015 [place], [place]. This information is not intended to replace advice given to you by your health care provider. Make sure you discuss any questions you have with your health care provider. HIV Antibody Test HIV is a virus which destroys our body's ability to fight illness. It does this by causing defects in our immune system. This is the system that protects our body against infections. This virus is the cause of an illness called AIDS. HIV antibodies are made by the  infected person's body when a person becomes infected with HIV. WHAT IS THE HIV ANTIBODY TEST? This is a test for HIV antibodies that are found in the blood of an infected person. This test is not a test for AIDS. It only means that you have been infected with HIV and may eventually develop AIDS. WHO IS AT RISK OF BEING INFECTED WITH HIV?  People who have unsafe sex  (unsafe sex means having sex without a condom (or other protective barrier) with a person who has the virus.  People who share IV needles or syringes with a person who has the virus.  Anyone who got blood, blood products, or organ transplants before 1985.  Babies born to mothers who have HIV.  Coming in contact with blood or other body fluids of someone infected with HIV. WHAT DOES A NEGATIVE HIV TEST RESULT MEAN? HIV antibodies were not found in your blood. Most of the time it takes our bodies between 6 weeks and 6 months to develop antibodies to HIV. It may take up to one year to develop. During this time, infected people can have a negative result even if they have the virus and will therefore not know if they are putting other people at risk. They should take all necessary precautions to protect others from becoming infected. WHAT DOES A POSITIVE HIV TEST RESULT MEAN?  A positive HIV test means a person has been infected with the HIV virus. This does NOT mean that a person has AIDS, but they may eventually develop it.  A person can give HIV infection to other people through unsafe sex. Sharing IV needles or syringes can also spread HIV.  A woman who has HIV can give the virus to her baby during pregnancy or at birth or possibly from breastfeeding. Get counseling prior to considering pregnancy. One third to one half of women with HIV infection will pass this infection on to their baby.  Anyone with a positive test for HIV should not donate blood, plasma, blood products, organs or tissues. WHERE CAN I GO TO BE TESTED?  Most county health departments offer HIV Antibody counseling and testing.  Many doctors and other caregivers offer HIV Antibody counseling and testing. If you received a blood test from your caregiver, call for your results as instructed. Remember it is your responsibility to get the results of your test. Do not assume everything is fine if you do not hear from your  caregiver. If you get a positive test result, talk to your caregiver to find out what steps to take to assure you receive the best of care. Numerous medications are now available which improve the course of this infection. Document Released: 07/06/2000 Document Revised: 10/01/2011 Document Reviewed: 10/09/2013 [facility] Patient Information 2015 [place], [place]. This information is not intended to replace advice given to you by your health care provider. Make sure you discuss any questions you have with your health care provider. HIV Treatment, HAART There is no cure for HIV at this time. Treatments are available that slow the disease for many years and improve the quality of life. Antiviral therapy suppresses the growth of the HIV virus in the body. A combination of several antiretroviral agents has been highly effective in reducing the number of HIV particles in the blood stream. This treatment is called Highly Active Anti-Retroviral Therapy (HAART). Success of this treatment is measured by a blood test called the viral load. This treatment can help improve the immune system and improve T-cell counts.  HAART is not a cure for HIV. People on HAART with suppressed levels of HIV can still give others the HIV virus through sex or sharing of needles. There is good evidence that if the levels of HIV remain suppressed, and the CD4 count (used to assess the immune system of patients) remains high (greater than 200), that life and quality of life can be significantly prolonged and improved. Genetic tests can be used to determine if the virus has become resistant to a particular drug. These tests are useful in deciding the best drug combination and adjusting the drug if it starts to fail. When HIV becomes resistant to HAART, the therapy must be changed to try and suppress the resistant strain of HIV. Different combinations of medications are tried to reduce viral load. This may not be successful, and the patient may  develop AIDS. RISK AND COMPLICATIONS HAART is a collection of different medications. They have their own side effects. Some common side effects are:  Feeling sick to your stomach.  Headache.  Weakness.  Fat accumulation on your back and belly (abdomen) called a "buffalo hump,"(lipodystrophy).  Malaise. When used long-term, these medications may increase the risk of heart disease by affecting fat metabolism. If you are on HAART you will be carefully monitored for possible side effects. In addition, routine blood tests measuring CD4 counts and HIV viral load should be taken every 3 to 4 months. The goal is to:  Get the CD4 count as close to normal as possible.  Suppress the HIV viral load to an undetectable level. Other antiviral agents are being looked at. Many new drugs are in the pipeline. Growth factors that stimulate cell growth are sometimes used to treat low red blood cell count (anemia) and low white blood cell counts associated with AIDS. Examples of these are Epogen and G-CSF. Medications are also used to prevent infections such as pneumonia and can keep AIDS patients healthier for longer periods of time. Infections are treated as they occur.  HIV becomes resistant in patients who do not take their medications every day. Also, certain strains of HIV mutate easily and may become resistant to HAART very quickly. Take all medications as directed. MAKE SURE YOU:   Understand these instructions.  Will watch your condition.  Will get help right away if you are not doing well or get worse. Document Released: 09/29/2002 Document Revised: 10/01/2011 Document Reviewed: 05/19/2008 [facility] Patient Information 2015 [place], [place]. This information is not intended to replace advice given to you by your health care provider. Make sure you discuss any questions you have with your health care provider.

## 2015-03-02 NOTE — Consult Note (Signed)
[facility] Infectious [facility]    Date of Admission:  02/28/2015  Date of Consult:  03/02/2015  Reason for Consult: HIV infection and neck abscess Referring Physician: Dr Beryle Beams and Terrilyn Saver "auto-consult" triggered by + HIV tet   HPI: Seth Carroll is an 29 y.o. male AA man who was visiting [place] after returning from [place] to [place] when he presented abscess near mandible. He has been seen by Dr. Wilburn Cornelia from ENT who felt patient could be managed with antibiotics alone and no surgery has been performed and indeed the patient has improved on clindamycin.   In the interim his HIV test 4th generation test has come back positive. He last tested for HIV a year ago and was - at that time. 4th gen assay shows + discriminatory antibody  I have ordered HIV quant VL, genotype, CD4 HLA b701, hepatitis testing, RPR, gc and chlamydia.  I would be happy to have seen the patient in our clinic and get him onto [facility] promptly via [place] but he is returning to [place] in the next 1-2 days..   Past Medical History  Diagnosis Date  . Abscess of neck 02/2015    Past Surgical History  Procedure Laterality Date  . Hernia repair    ergies:   Allergies  Allergen Reactions  . Penicillins Other (See Comments)    "Childhood per pt"     Medications: I have reviewed patients current medications as documented in Epic Anti-infectives    Start     Dose/Rate Route Frequency Ordered Stop   03/02/15 0000  clindamycin (CLEOCIN) 300 MG capsule  Status:  Discontinued     600 mg Oral 4 times daily 03/02/15 1328 03/02/15    03/02/15 0000  clindamycin (CLEOCIN) 300 MG capsule     600 mg Oral 4 times daily 03/02/15 1513 03/23/15 2359   02/28/15 1500  clindamycin (CLEOCIN) IVPB 600 mg  Status:  Discontinued     600 mg 100 mL/hr over 30 Minutes Intravenous 3 times per day 02/28/15 0938 03/02/15 2049   02/28/15 0515  clindamycin (CLEOCIN) IVPB 900 mg     900 mg 100  mL/hr over 30 Minutes Intravenous  Once 02/28/15 0509 02/28/15 0841      Social History:  reports that he has been smoking Cigarettes.  He has a 10 pack-year smoking history. He has never used smokeless tobacco. He reports that he drinks alcohol. He reports that he does not use illicit drugs.  History reviewed. No pertinent family history.  As in HPI and primary teams notes otherwise 12 point review of systems is negative  Blood pressure 129/81, pulse 54, temperature 97.7 F (36.5 C), temperature source Oral, resp. rate 18, weight 190 lb (86.183 kg), SpO2 99 %. General: Alert and awake, oriented x3, not in any acute distress. HEENT: anicteric sclera, , EOMI, oropharynx clear and without exudate, still some tenderness on left neck CVS regular rate, normal r,  no murmur rubs or gallops Chest: clear to auscultation bilaterally, no wheezing, rales or rhonchi Abdomen: soft nontender, nondistended, normal bowel sounds, Extremities: no  clubbing or edema noted bilaterally Skin: no rashes Neuro: nonfocal, strength and sensation intact   No results found for this or any previous visit (from the past 48 hour(s)). CBC Latest Ref Rng 02/28/2015 02/28/2015  WBC 4.0 - 10.5 K/uL - 9.9  Hemoglobin 13.0 - 17.0 g/dL 15.0 14.3  Hematocrit 39.0 - 52.0 % 44.0 41.6  Platelets 150 - 400 K/uL - 217       ) Recent Results (from the past 720 hour(s))  Culture, blood (routine x 2)     Status: None (Preliminary result)   Collection Time: 02/28/15 12:00 PM  Result Value Ref Range Status   Specimen Description BLOOD RIGHT ARM  Final   Special Requests BOTTLES DRAWN AEROBIC AND ANAEROBIC 10CC  Final   Culture NO GROWTH 2 DAYS  Final   Report Status PENDING  Incomplete  Culture, blood (routine x 2)     Status: None (Preliminary result)   Collection Time: 02/28/15 12:08 PM  Result Value Ref Range Status   Specimen Description BLOOD RIGHT ARM  Final   Special Requests BOTTLES DRAWN AEROBIC AND ANAEROBIC 10CC   Final   Culture NO GROWTH 2 DAYS  Final   Report Status PENDING  Incomplete     Impression/Recommendation  Principal Problem:   Neck abscess Active Problems:   HIV disease   Tobacco abuse   Seth Carroll is a 29 y.o. male with  Newly diagnosed HIV and neck abscess  #1 Neck abscess: would provide with 3 week rx for oral clindamycin since he is NOT going to followup here locally and he has NOT had surgical treatment  He reportedly has PCP in [place]  #2 He needs to establish care in clinic in [place], preferably a [facility] clinic. In [place] he would be eligible for Medicaid expansion I would think.  I spent greater than 60 minutes with the patient including greater than 50% of time in face to face counsel of the patient and in coordination of their care.     03/02/2015, 10:06 PM   Thank you so much for this interesting consult  [place] for [place] [phone] (pager) [phone] (office) 03/02/2015, 10:06 PM  Rhina Brack[place] 03/02/2015, 10:06 PM

## 2015-03-03 LAB — T-HELPER CELLS (CD4) COUNT (NOT AT ARMC)
CD4 % Helper T Cell: 28 % — ABNORMAL LOW (ref 33–55)
CD4 T CELL ABS: 740 /uL (ref 400–2700)

## 2015-03-03 LAB — RPR: RPR: REACTIVE — AB

## 2015-03-03 LAB — HEPATITIS PANEL, ACUTE
HEP A IGM: NEGATIVE
HEP B C IGM: NEGATIVE
HEP B S AG: NEGATIVE

## 2015-03-03 LAB — RPR, QUANT+TP ABS (REFLEX)
Rapid Plasma Reagin, Quant: 1:16 {titer} — ABNORMAL HIGH
T Pallidum Abs: POSITIVE — AB

## 2015-03-03 LAB — HEPATITIS B SURFACE ANTIBODY, QUANTITATIVE: Hepatitis B-Post: 659.1 m[IU]/mL (ref >9.9)

## 2015-03-05 LAB — CULTURE, BLOOD (ROUTINE X 2)
CULTURE: NO GROWTH
Culture: NO GROWTH

## 2015-03-07 LAB — HLA B*5701: HLA B 5701: NEGATIVE

## 2015-03-10 LAB — HIV-1 INTEGRASE GENOTYPE

## 2015-03-11 LAB — HIV-1 RNA ULTRAQUANT REFLEX TO GENTYP+
HIV-1 RNA BY PCR: 2510 copies/mL
HIV-1 RNA Quant, Log: 3.4 log10copy/mL

## 2015-03-11 LAB — REFLEX TO GENOSURE(R) MG: HIV GENOSURE(R) MG PDF: 0

## 2016-07-05 IMAGING — CT CT NECK W/ CM
4 of 5 series · 15 of 33 positions shown, 17 images · IV contrast (Iodine)
Comparison: None.

CLINICAL DATA: Left submandibular swelling with pus, acute onset.
Headache and left-sided jaw pain. Initial encounter.

EXAM:
CT NECK WITH CONTRAST
TECHNIQUE: Multidetector CT imaging of the neck was performed using the
standard protocol following the bolus administration of intravenous
contrast.
CONTRAST:  75mL OMNIPAQUE IOHEXOL 300 MG/ML  SOLN

[Series 201: soft tissue, idose (2) · axial · 0.53mm/px · z∈[+50,+162]mm · 3 of 113 slices shown]
[im 29/113  soft-tissue]
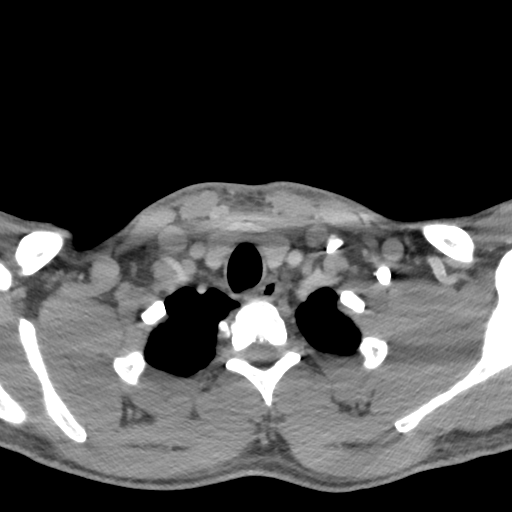
[im 57/113  soft-tissue]
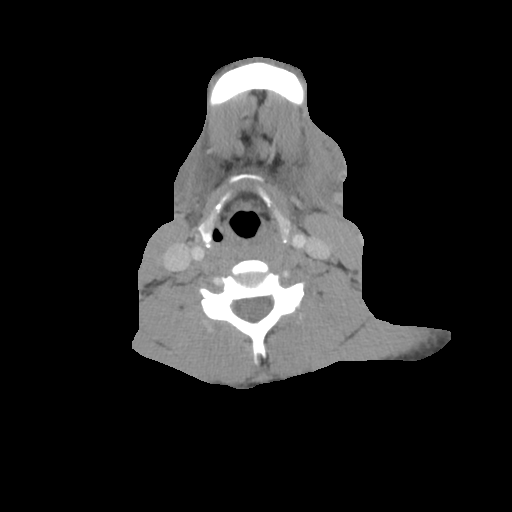
[im 85/113  soft-tissue]
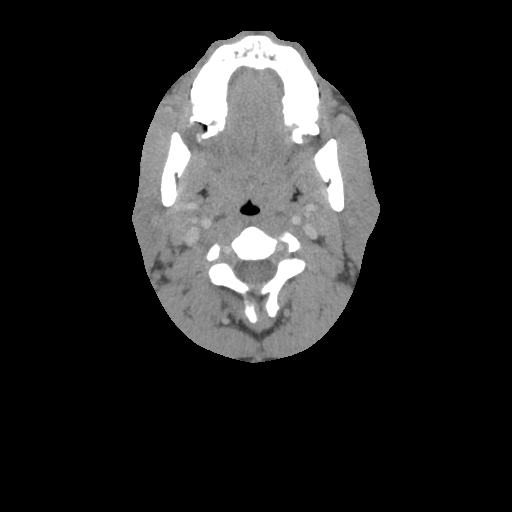

[Series 203: coronal, idose (2) · coronal · 0.45mm/px · 3 of 123 slices shown]
[im 25/123  bone]
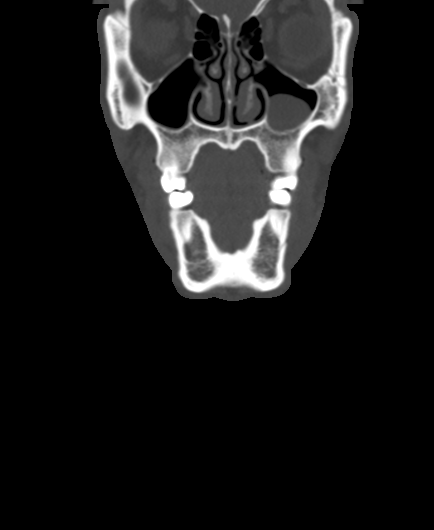
[im 49/123  bone]
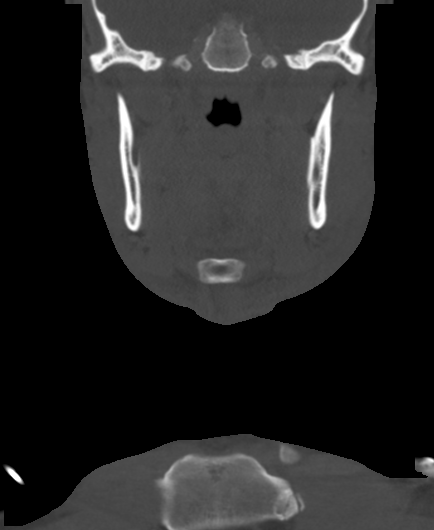
[im 74/123  bone]
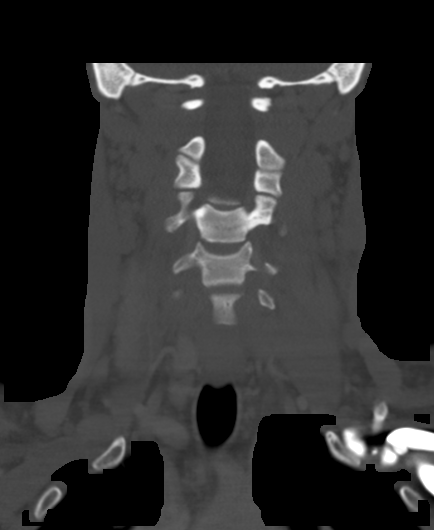

[Series 204: sagittal, idose (2) · sagittal · 0.45mm/px · 5 of 97 slices shown, 6 images]
[im 33/97  bone]
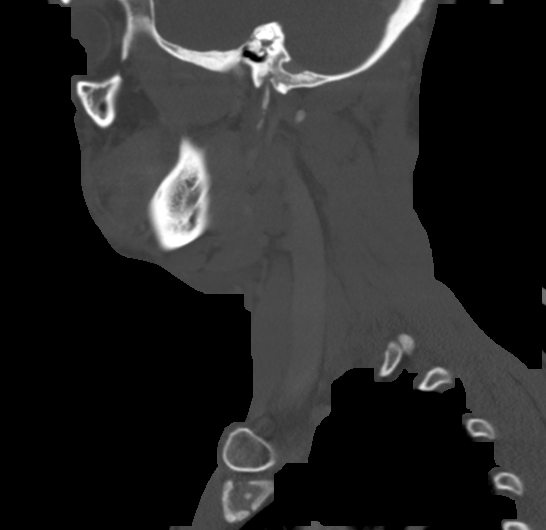
[im 41/97  bone]
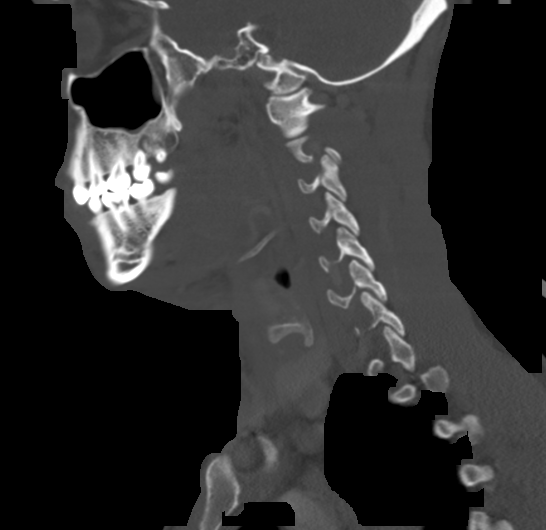
[im 49/97  soft-tissue]
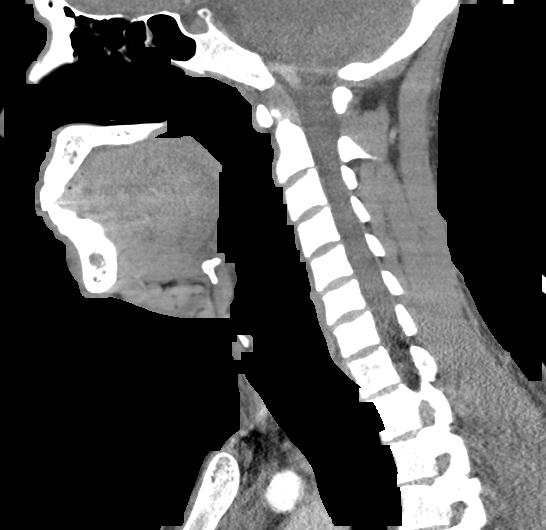
[im 49/97  bone]
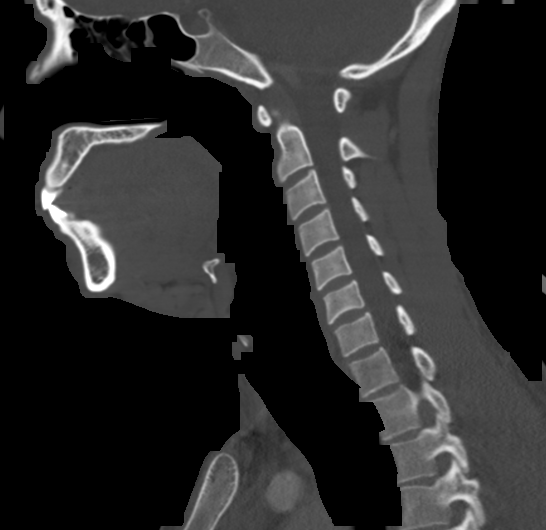
[im 57/97  bone]
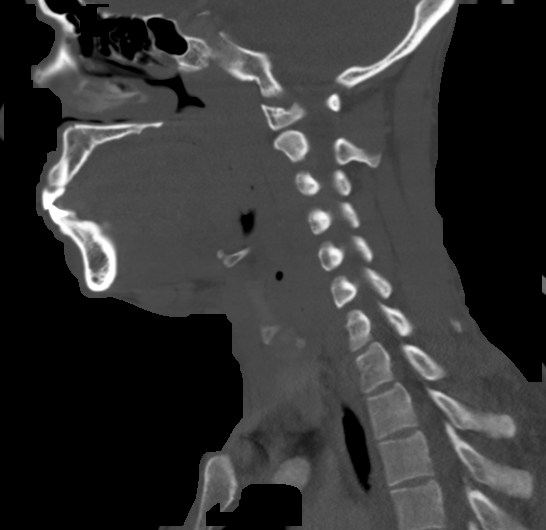
[im 65/97  bone]
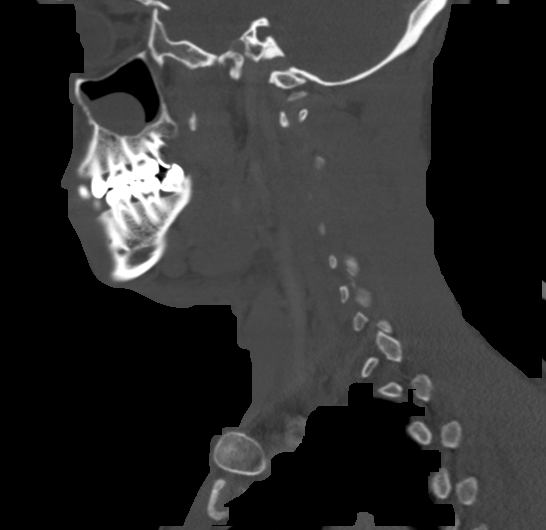

[Series 205: orthogonal, idose (2) · axial · 0.57mm/px · z∈[+21,+162]mm · 4 of 121 slices shown, 5 images]
[im 25/121  soft-tissue]
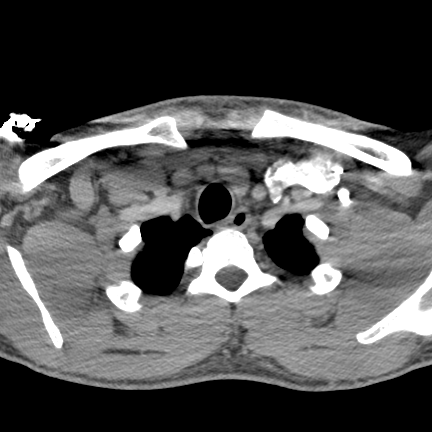
[im 25/121  bone]
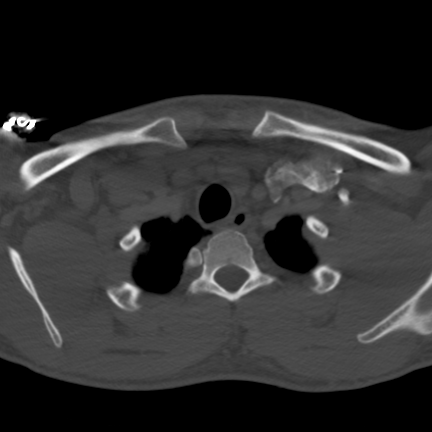
[im 49/121  bone]
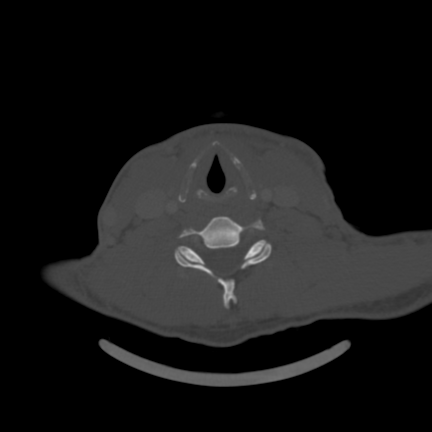
[im 73/121  bone]
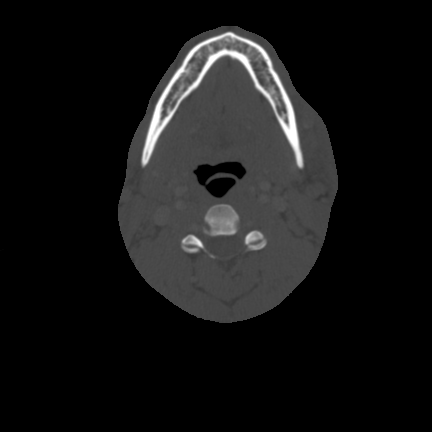
[im 97/121  bone]
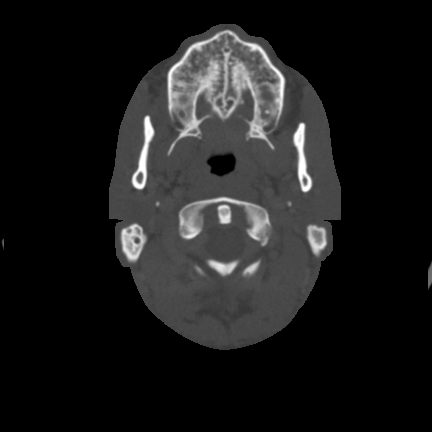

[15 of 33 positions shown; findings below may reference images not displayed]

FINDINGS: There is a 2.0 x 1.8 x 1.3 cm vague collection of subcutaneous fluid
lateral to the left mandible, likely reflecting an evolving abscess
given visualized pus. Overlying skin thickening and diffuse soft
tissue edema are seen, with diffuse inflammation tracking about the
left platysma. Soft tissue inflammation tracks superiorly and
inferiorly along the left neck.

More mild soft tissue inflammation is noted along the right side of
the neck, and tracking under the chin. Enlarged nodes are noted
inferior to the digastric muscles, measuring up to 1.5 cm in short
axis. Right submandibular nodes measure up to 1.4 cm in short axis.
Diffuse soft tissue edema is noted within the parapharyngeal fat
planes bilaterally. Underlying mild Ludwig's angina cannot be
excluded.

Pharynx and larynx: The nasopharynx, oropharynx and hypopharynx are
unremarkable. The valleculae and piriform sinuses within normal
limits. The proximal trachea is unremarkable in appearance. The
palatine tonsils and adenoids are grossly unremarkable.

Salivary glands: The parotid and submandibular glands are symmetric
and unremarkable in appearance.

Thyroid: The thyroid gland is unremarkable in appearance.

Lymph nodes: Lymphadenopathy is noted as described above. No
additional enlarged cervical nodes are seen.

Vascular: The visualized vasculature is grossly unremarkable, though
the vasculature is difficult to fully assess in the areas of soft
tissue edema.

Limited intracranial: The visualized bones of the brain are grossly
unremarkable.

Visualized orbits: The visualized portions of the orbits are within
normal limits.

Mastoids and visualized paranasal sinuses: A mucus retention cyst or
polyp is noted at the left maxillary sinus. Mild mucosal thickening
is seen at the right maxillary sinus. There is partial opacification
of the mastoid air cells bilaterally.

Skeleton: Large dental caries are seen involving the third maxillary
molars bilaterally. No acute osseous abnormalities are otherwise
seen. Prevertebral soft tissues are within normal limits.

Upper chest: The visualized lung apices are clear. The visualized
superior mediastinum is grossly unremarkable in appearance.
IMPRESSION: 1. 2.0 x 1.8 x 1.3 cm vague collection of subcutaneous fluid lateral
to the left mandible, likely reflecting an evolving abscess given
visualized pus. Overlying skin thickening and diffuse soft tissue
edema, with diffuse inflammation tracking about the left platysma.
2. Diffuse soft tissue inflammation tracks about both sides of the
neck, more prominent on the left, and under the chin. Enlarged nodes
inferior to the digastric muscles measure up to 1.5 cm in short
axis, and enlarged right submandibular nodes measure up to 1.4 cm in
short axis. Diffuse soft tissue edema within the parapharyngeal fat
planes bilaterally. Underlying mild Ludwig's angina cannot be
excluded, given diffuse inflammation extending below the floor of
the mouth. No evidence of airway compromise at this time.
3. Mucus retention cyst or polyp at the left maxillary sinus. Mild
mucosal thickening at the right maxillary sinus. Partial
opacification of the mastoid air cells bilaterally.
4. Large dental caries involving the third maxillary molars
bilaterally.

These results were called by telephone at the time of interpretation
on 02/28/2015 at [DATE] to Dr. MANUEL MAURICIO SUMIDA, who verbally
acknowledged these results.

## 2017-01-09 ENCOUNTER — Emergency Department (HOSPITAL_BASED_OUTPATIENT_CLINIC_OR_DEPARTMENT_OTHER)
Admission: EM | Admit: 2017-01-09 | Discharge: 2017-01-09 | Disposition: A | Discharge status: Home / Self Care | Payer: Medicaid - Out of State | Attending: Emergency Medicine | Admitting: Emergency Medicine

## 2017-01-09 ENCOUNTER — Encounter (HOSPITAL_BASED_OUTPATIENT_CLINIC_OR_DEPARTMENT_OTHER): Payer: Self-pay | Admitting: Emergency Medicine

## 2017-01-09 DIAGNOSIS — Y9301 Activity, walking, marching and hiking: Secondary | ICD-10-CM | POA: Insufficient documentation

## 2017-01-09 DIAGNOSIS — S50811A Abrasion of right forearm, initial encounter: Secondary | ICD-10-CM

## 2017-01-09 DIAGNOSIS — Y929 Unspecified place or not applicable: Secondary | ICD-10-CM | POA: Insufficient documentation

## 2017-01-09 DIAGNOSIS — Y999 Unspecified external cause status: Secondary | ICD-10-CM | POA: Diagnosis not present

## 2017-01-09 DIAGNOSIS — S81851A Open bite, right lower leg, initial encounter: Secondary | ICD-10-CM | POA: Insufficient documentation

## 2017-01-09 DIAGNOSIS — W540XXA Bitten by dog, initial encounter: Secondary | ICD-10-CM | POA: Diagnosis not present

## 2017-01-09 DIAGNOSIS — F17210 Nicotine dependence, cigarettes, uncomplicated: Secondary | ICD-10-CM | POA: Diagnosis not present

## 2017-01-09 DIAGNOSIS — B20 Human immunodeficiency virus [HIV] disease: Secondary | ICD-10-CM | POA: Diagnosis not present

## 2017-01-09 DIAGNOSIS — S8991XA Unspecified injury of right lower leg, initial encounter: Secondary | ICD-10-CM | POA: Diagnosis present

## 2017-01-09 HISTORY — DX: Asymptomatic human immunodeficiency virus (hiv) infection status: Z21

## 2017-01-09 HISTORY — DX: Human immunodeficiency virus (HIV) disease: B20

## 2017-01-09 MED ORDER — DOXYCYCLINE HYCLATE 100 MG PO TABS
100.0000 mg | ORAL_TABLET | Freq: Once | ORAL | Status: AC
  Administered 2017-01-09: 100 mg via ORAL
  Filled 2017-01-09: qty 1

## 2017-01-09 MED ORDER — DOXYCYCLINE HYCLATE 100 MG PO CAPS: 100.0000 mg | ORAL_CAPSULE | Freq: Two times a day (BID) | ORAL | 0 refills | Status: DC

## 2017-01-09 MED ORDER — AMMONIA AROMATIC IN INHA
RESPIRATORY_TRACT | Status: AC
  Filled 2017-01-09: qty 10

## 2017-01-09 MED ORDER — LEVOFLOXACIN 750 MG PO TABS
750.0000 mg | ORAL_TABLET | Freq: Every day | ORAL | Status: DC
  Administered 2017-01-09: 750 mg via ORAL
  Filled 2017-01-09: qty 1

## 2017-01-09 NOTE — ED Triage Notes (Signed)
Vitals with EMS 130/80  HR 90  resp 16   CBG 95

## 2017-01-09 NOTE — ED Triage Notes (Signed)
Patient reports that he was at a junk yard  - he was searching through the parts when a dog ran out from under and car and bite and scratched him to the right ankle and arm. The patient reports that as the day progressed it got worse

## 2017-01-09 NOTE — ED Provider Notes (Signed)
[facility] Provider Note: Lowella Dell, MD, FACEP  CSN: [id number] MRN: [id number] ARRIVAL: 01/09/17 at 0040 ROOM: [facility]   CHIEF COMPLAINT  Animal Bite   HISTORY OF PRESENT ILLNESS  Seth Carroll is a 31 y.o. HIV-positive male who was bitten on the right lower leg by a dog at a junk yard yesterday afternoon about 5 PM. He has a laceration to his right lower leg. He states the dog is a known dog and can be investigated. He states his tetanus is up-to-date. He also has abrasions to his right forearm from a fall associated with the bite. He rates the pain in his leg as an 8 out of 10. He has been drinking alcohol.    Past Medical History:  Diagnosis Date  . Abscess of neck 02/2015  . HIV (human immunodeficiency virus infection) (HCC)     Past Surgical History:  Procedure Laterality Date  . HERNIA REPAIR      History reviewed. No pertinent family history.  Social History  Substance Use Topics  . Smoking status: Current Every Day Smoker    Packs/day: 1.00    Years: 10.00    Types: Cigarettes  . Smokeless tobacco: Never Used  . Alcohol use Yes     Comment: weekends    Prior to Admission medications   Not on File    Allergies Penicillins   REVIEW OF SYSTEMS  Negative except as noted here or in the History of Present Illness.   PHYSICAL EXAMINATION  Initial Vital Signs Blood pressure 121/68, pulse (!) 103, temperature 98.3 F (36.8 C), temperature source Oral, resp. rate 18, height 6\' 1"  (1.854 m), weight 87.5 kg (193 lb), SpO2 100 %.  Examination General: Well-developed, well-nourished male in no acute distress; appearance consistent with age of record HENT: normocephalic; atraumatic Eyes: Normal appearance Neck: supple Heart: regular rate and rhythm Lungs: clear to auscultation bilaterally Abdomen: soft; nondistended; nontender; bowel sounds present Extremities: No deformity; full range of motion; pulses normal Neurologic: Sleeping but  arousable; dysarthria; ataxia; motor function intact in all extremities and symmetric; no facial droop Skin: Warm and dry; abrasions right dorsal proximal forearm; flap laceration right lateral lower leg:     RESULTS  Summary of this visit's results, reviewed by myself:   EKG Interpretation  Date/Time:    Ventricular Rate:    PR Interval:    QRS Duration:   QT Interval:    QTC Calculation:   R Axis:     Text Interpretation:        Laboratory Studies: No results found for this or any previous visit (from the past 24 hour(s)). Imaging Studies: No results found.  ED COURSE  Nursing notes and initial vitals signs, including pulse oximetry, reviewed.  Vitals:   01/09/17 0041 01/09/17 0044  BP: 128/75 121/68  Pulse: (!) 104 (!) 103  Resp: 18 18  Temp: 98.5 F (36.9 C) 98.3 F (36.8 C)  TempSrc: Oral Oral  SpO2: 97% 100%  Weight: 87.5 kg (193 lb)   Height: 6\' 1"  (1.854 m)    Primary closure of wound is not indicated as this represents an acute animal bite. We will report this to animal control for rabies follow-up. We will treat with antibiotics for wound prophylaxis.  PROCEDURES    ED DIAGNOSES     ICD-10-CM   1. Dog bite of right lower leg, initial encounter S81.851A    W54.0XXA   2. Abrasion of right forearm, initial encounter [phone]  Kimberlye Dilger, Jonny RuizJohn, MD 01/09/17 [phone]

## 2017-12-13 ENCOUNTER — Encounter (HOSPITAL_COMMUNITY): Payer: Self-pay

## 2017-12-13 ENCOUNTER — Emergency Department (HOSPITAL_COMMUNITY)
Admission: EM | Admit: 2017-12-13 | Discharge: 2017-12-13 | Disposition: A | Discharge status: Home / Self Care | Payer: Medicaid - Out of State | Attending: Emergency Medicine | Admitting: Emergency Medicine

## 2017-12-13 ENCOUNTER — Other Ambulatory Visit: Payer: Self-pay

## 2017-12-13 DIAGNOSIS — F17210 Nicotine dependence, cigarettes, uncomplicated: Secondary | ICD-10-CM | POA: Insufficient documentation

## 2017-12-13 DIAGNOSIS — B20 Human immunodeficiency virus [HIV] disease: Secondary | ICD-10-CM

## 2017-12-13 DIAGNOSIS — Z79899 Other long term (current) drug therapy: Secondary | ICD-10-CM | POA: Insufficient documentation

## 2017-12-13 DIAGNOSIS — R339 Retention of urine, unspecified: Secondary | ICD-10-CM

## 2017-12-13 DIAGNOSIS — R338 Other retention of urine: Secondary | ICD-10-CM | POA: Insufficient documentation

## 2017-12-13 LAB — BASIC METABOLIC PANEL
ANION GAP: 10 (ref 5–15)
BUN: 9 mg/dL (ref 6–20)
CHLORIDE: 107 mmol/L (ref 101–111)
CO2: 23 mmol/L (ref 22–32)
Calcium: 9.4 mg/dL (ref 8.9–10.3)
Creatinine, Ser: 1.01 mg/dL (ref 0.61–1.24)
GFR calc non Af Amer: >60 mL/min (ref >60)
Glucose, Bld: 93 mg/dL (ref 65–99)
POTASSIUM: 3.6 mmol/L (ref 3.5–5.1)
Sodium: 140 mmol/L (ref 135–145)

## 2017-12-13 LAB — CBC WITH DIFFERENTIAL/PLATELET
ABS IMMATURE GRANULOCYTES: 0 10*3/uL (ref 0.0–0.1)
Basophils Absolute: 0 10*3/uL (ref 0.0–0.1)
Basophils Relative: 1 %
Eosinophils Absolute: 0 10*3/uL (ref 0.0–0.7)
Eosinophils Relative: 1 %
HEMATOCRIT: 46.4 % (ref 39.0–52.0)
Hemoglobin: 15.9 g/dL (ref 13.0–17.0)
IMMATURE GRANULOCYTES: 0 %
LYMPHS ABS: 2.6 10*3/uL (ref 0.7–4.0)
Lymphocytes Relative: 48 %
MCH: 32.7 pg (ref 26.0–34.0)
MCHC: 34.3 g/dL (ref 30.0–36.0)
MCV: 95.5 fL (ref 78.0–100.0)
MONOS PCT: 11 %
Monocytes Absolute: 0.6 10*3/uL (ref 0.1–1.0)
NEUTROS ABS: 2.2 10*3/uL (ref 1.7–7.7)
NEUTROS PCT: 39 %
Platelets: 215 10*3/uL (ref 150–400)
RBC: 4.86 MIL/uL (ref 4.22–5.81)
RDW: 14 % (ref 11.5–15.5)
WBC: 5.5 10*3/uL (ref 4.0–10.5)

## 2017-12-13 LAB — URINALYSIS, ROUTINE W REFLEX MICROSCOPIC
Bilirubin Urine: NEGATIVE
GLUCOSE, UA: NEGATIVE mg/dL
HGB URINE DIPSTICK: NEGATIVE
KETONES UR: NEGATIVE mg/dL
Leukocytes, UA: NEGATIVE
Nitrite: NEGATIVE
PH: 6 (ref 5.0–8.0)
Protein, ur: NEGATIVE mg/dL
SPECIFIC GRAVITY, URINE: 1.003 — AB (ref 1.005–1.030)

## 2017-12-13 MED ORDER — CIPROFLOXACIN HCL 250 MG PO TABS: 250.0000 mg | ORAL_TABLET | Freq: Two times a day (BID) | ORAL | 0 refills | Status: AC

## 2017-12-13 MED ORDER — TAMSULOSIN HCL 0.4 MG PO CAPS: 0.4000 mg | ORAL_CAPSULE | Freq: Every day | ORAL | 0 refills | Status: DC

## 2017-12-13 MED ORDER — TAMSULOSIN HCL 0.4 MG PO CAPS
0.4000 mg | ORAL_CAPSULE | Freq: Every day | ORAL | Status: DC
  Administered 2017-12-13: 0.4 mg via ORAL
  Filled 2017-12-13: qty 1

## 2017-12-13 NOTE — ED Provider Notes (Addendum)
[facility] EMERGENCY DEPARTMENT Provider Note   CSN: [id number] Arrival date & time: 12/13/17  1239     History   Chief Complaint Chief Complaint  Patient presents with  . Urinary Retention    HPI Seth Carroll is a 32 y.o. male who presents with acute urinary retention.  Past medical history significant for HIV not currently on any medication.  States that today he has been drinking a lot of fluids and while he was driving he felt the urge to urinate.  There is became so great however when he tried to go to the bathroom he could not urinate.  He started to become distressed and decided to come to the emergency department.  He states that he is from [place].  He moved here in early January.  He has been off of his ART since March.  He states that it was giving him "kidney pain".  Recently he denies any symptoms.  He denies recent fever, abdominal pain, flank pain, back pain, testicular pain, rectal pain.  He has never had this problem before.  HPI  Past Medical History:  Diagnosis Date  . Abscess of neck 02/2015  . HIV (human immunodeficiency virus infection) [facility])     Patient Active Problem List   Diagnosis Date Noted  . HIV disease ([place]) 03/02/2015  . Tobacco abuse 03/02/2015  . Neck pain   . Neck abscess 02/28/2015    Past Surgical History:  Procedure Laterality Date  . HERNIA REPAIR          Home Medications    Prior to Admission medications   Medication Sig Start Date End Date Taking? Authorizing Provider  doxycycline (VIBRAMYCIN) 100 MG capsule Take 1 capsule (100 mg total) by mouth 2 (two) times daily. One po bid x 7 days 01/09/17   Molpus, John, MD    Family History No family history on file.  Social History Social History   Tobacco Use  . Smoking status: Current Every Day Smoker    Packs/day: 1.00    Years: 10.00    Pack years: 10.00    Types: Cigarettes  . Smokeless tobacco: Never Used  Substance Use Topics  . Alcohol use: Yes     Comment: weekends  . Drug use: No     Allergies   Penicillins   Review of Systems Review of Systems  Gastrointestinal: Positive for abdominal pain (Resolved after catheter placement). Negative for nausea, rectal pain and vomiting.  Genitourinary: Positive for difficulty urinating. Negative for dysuria, flank pain, frequency, penile pain and testicular pain.  Allergic/Immunologic: Positive for immunocompromised state.  All other systems reviewed and are negative.    Physical Exam Updated Vital Signs BP (!) 169/120   Pulse (!) 118   Temp 98.3 F (36.8 C) (Oral)   Resp (!) 22   Ht '6\' 1"'  (1.854 m)   Wt 97.5 kg (215 lb)   SpO2 100%   BMI 28.37 kg/m   Physical Exam  Constitutional: He is oriented to person, place, and time. He appears well-developed and well-nourished. No distress.  Calm and cooperative  HENT:  Head: Normocephalic and atraumatic.  Eyes: Pupils are equal, round, and reactive to light. Conjunctivae are normal. Right eye exhibits no discharge. Left eye exhibits no discharge. No scleral icterus.  Neck: Normal range of motion.  Cardiovascular: Normal rate and regular rhythm.  Pulmonary/Chest: Effort normal and breath sounds normal. No respiratory distress.  Abdominal: Soft. Bowel sounds are normal. He exhibits no  distension. There is no tenderness.  Genitourinary:  Genitourinary Comments: Rectal: No gross blood, hemorrhoids, fissures, redness, area of fluctuance, lesions, or tenderness. Chaperone present during exam.   Neurological: He is alert and oriented to person, place, and time.  Skin: Skin is warm and dry.  Psychiatric: He has a normal mood and affect. His behavior is normal.  Nursing note and vitals reviewed.    ED Treatments / Results  Labs (all labs ordered are listed, but only abnormal results are displayed) Labs Reviewed  URINALYSIS, ROUTINE W REFLEX MICROSCOPIC - Abnormal; Notable for the following components:      Result Value   Color,  Urine STRAW (*)    Specific Gravity, Urine 1.003 (*)    All other components within normal limits  URINE CULTURE  BASIC METABOLIC PANEL  CBC WITH DIFFERENTIAL/PLATELET    EKG None  Radiology No results found.  Procedures Procedures (including critical care time)  Medications Ordered in ED Medications  tamsulosin (FLOMAX) capsule 0.4 mg (0.4 mg Oral Given 12/13/17 1450)     Initial Impression / Assessment and Plan / ED Course  I have reviewed the triage vital signs and the nursing notes.  Pertinent labs & imaging results that were available during my care of the patient were reviewed by me and considered in my medical decision making (see chart for details).  32 year old male with past medical history of HIV presents with acute urinary retention.  He is hypertensive and tachycardic which is likely due to pain.  Etiology is unclear.  He reports feeling feeling well this morning. In-and-out cath was done and he states symptoms have totally resolved.  Blood work is unremarkable and urine sample does not show evidence of infection. Discussed with Dr. Regenia Skeeter who spoke with urology.  They recommend indwelling Foley catheter placement, Flomax and urology follow-up.  They patient was given fluid and tried to urinate again therefore indwelling foley was ordered. Discussed the plan with patient who is in agreement.    Nursing staff requested assistance in the room. They attempted catheter placement with a coude however resistance was met however there was urine return. When the balloon was blown up it caused the patient significant pain. The patient was not able to tolerate the foley and therefore it was removed. Will consult Urology.  3:33 PM Discussed with Dr. McDiarmid. He will come to see the patient. Care transferred to Francesco Sor MD    Final Clinical Impressions(s) / ED Diagnoses   Final diagnoses:  Acute urinary retention    ED Discharge Orders    None       Recardo Evangelist, PA-C 12/13/17 1602    Recardo Evangelist, PA-C 12/13/17 3748    Sherwood Gambler, MD 12/16/17 1309

## 2017-12-13 NOTE — ED Notes (Signed)
Urology at bedside.

## 2017-12-13 NOTE — ED Triage Notes (Signed)
Pt states that the last time he urinated was about 8 am today, states that everything was normal. Pt states that he feels like he needs to urinate now and it will not come out. Pt also reports that he was on HIV medications up until 3 months ago r/t move.

## 2017-12-13 NOTE — ED Notes (Signed)
Pt requested drink. Okayed by PA. Pt given gingerale.

## 2017-12-13 NOTE — Discharge Instructions (Addendum)
Please make an appointment with Urology and infectious disease Take Flomax daily to help urine flow Return if worsening

## 2017-12-13 NOTE — ED Notes (Signed)
NT gave pt leg bag and explained how to use it.

## 2017-12-13 NOTE — ED Notes (Addendum)
This RN attempted to place coude indwelling catheter. RN met resistance (urine return was positive) when trying to finishing inserting catheter and pt experienced pain when RN balloon was slightly inflated. RN asked PA for assistance. Did not inflate balloon and removed coude catheter.

## 2017-12-13 NOTE — ED Notes (Signed)
In and out cathed pt- obtained . Pt urinating around catheter, so catether removed and pt was able to urinate on his own. Will bladder scan pt

## 2017-12-13 NOTE — ED Notes (Signed)
Called OR to request the Urology cart ASAP per provider.

## 2017-12-13 NOTE — Consult Note (Signed)
Urology Consult  Referring physician: Tyron Russell Reason for referral: unable to insert catheter  Chief Complaint: unable to insert catheter/catheter trauma  History of Present Illness: Patient had in and out for acute retention; then could not void again and underwent failed traumatic catheterization Today could not void; no ppt factors; no past history of GU surgery stones UTI; no neuro issues; has not voided since second cath attempt HIC postive No history of retention  Modifying factors: There are no other modifying factors  Associated signs and symptoms: There are no other associated signs and symptoms Aggravating and relieving factors: There are no other aggravating or relieving factors Severity: Moderate Duration: Persistent  Past Medical History:  Diagnosis Date  . Abscess of neck 02/2015  . HIV (human immunodeficiency virus infection) ([place])    Past Surgical History:  Procedure Laterality Date  . HERNIA REPAIR      Medications: I have reviewed the patient's current medications. Allergies:  Allergies  Allergen Reactions  . Penicillins Other (See Comments)    "Childhood per pt"    No family history on file. Social History:  reports that he has been smoking cigarettes.  He has a 10.00 pack-year smoking history. He has never used smokeless tobacco. He reports that he drinks alcohol. He reports that he does not use drugs.  ROS: All systems are reviewed and negative except as noted. Rest negative  Physical Exam:  Vital signs in last 24 hours: Temp:  [98.3 F (36.8 C)] 98.3 F (36.8 C) (05/24 1255) Pulse Rate:  [66-118] 66 (05/24 1530) Resp:  [22] 22 (05/24 1255) BP: (144-169)/(96-120) 152/96 (05/24 1530) SpO2:  [89 %-100 %] 89 % (05/24 1530) Weight:  [97.5 kg (215 lb)] 97.5 kg (215 lb) (05/24 1245)  Cardiovascular: Skin warm; not flushed Respiratory: Breaths quiet; no shortness of breath Abdomen: No masses Neurological: Normal sensation to  touch Musculoskeletal: Normal motor function arms and legs Lymphatics: No inguinal adenopathy Skin: No rashes Genitourinary:normal male genitalia Soft benign belly not overdistended  Laboratory Data:  Results for orders placed or performed during the hospital encounter of 12/13/17 (from the past 72 hour(s))  Basic metabolic panel     Status: None   Collection Time: 12/13/17  1:36 PM  Result Value Ref Range   Sodium 140 135 - 145 mmol/L   Potassium 3.6 3.5 - 5.1 mmol/L   Chloride 107 101 - 111 mmol/L   CO2 23 22 - 32 mmol/L   Glucose, Bld 93 65 - 99 mg/dL   BUN 9 6 - 20 mg/dL   Creatinine, Ser 1.01 0.61 - 1.24 mg/dL   Calcium 9.4 8.9 - 10.3 mg/dL   GFR calc non Af Amer >60 >60 mL/min   GFR calc Af Amer >60 >60 mL/min    Comment: (NOTE) The eGFR has been calculated using the CKD EPI equation. This calculation has not been validated in all clinical situations. eGFR's persistently <60 mL/min signify possible Chronic Kidney Disease.    Anion gap 10 5 - 15    Comment: Performed at [facility] [address] [place], [place] [address]  CBC with Differential     Status: None   Collection Time: 12/13/17  1:36 PM  Result Value Ref Range   WBC 5.5 4.0 - 10.5 K/uL   RBC 4.86 4.22 - 5.81 MIL/uL   Hemoglobin 15.9 13.0 - 17.0 g/dL   HCT 46.4 39.0 - 52.0 %   MCV 95.5 78.0 - 100.0 fL   MCH 32.7  26.0 - 34.0 pg   MCHC 34.3 30.0 - 36.0 g/dL   RDW 14.0 11.5 - 15.5 %   Platelets 215 150 - 400 K/uL   Neutrophils Relative % 39 %   Neutro Abs 2.2 1.7 - 7.7 K/uL   Lymphocytes Relative 48 %   Lymphs Abs 2.6 0.7 - 4.0 K/uL   Monocytes Relative 11 %   Monocytes Absolute 0.6 0.1 - 1.0 K/uL   Eosinophils Relative 1 %   Eosinophils Absolute 0.0 0.0 - 0.7 K/uL   Basophils Relative 1 %   Basophils Absolute 0.0 0.0 - 0.1 K/uL   Immature Granulocytes 0 %   Abs Immature Granulocytes 0.0 0.0 - 0.1 K/uL    Comment: Performed at [facility] [address], [place], [place] [address]   Urinalysis, Routine w reflex microscopic     Status: Abnormal   Collection Time: 12/13/17  1:37 PM  Result Value Ref Range   Color, Urine STRAW (A) YELLOW   APPearance CLEAR CLEAR   Specific Gravity, Urine 1.003 (L) 1.005 - 1.030   pH 6.0 5.0 - 8.0   Glucose, UA NEGATIVE NEGATIVE mg/dL   Hgb urine dipstick NEGATIVE NEGATIVE   Bilirubin Urine NEGATIVE NEGATIVE   Ketones, ur NEGATIVE NEGATIVE mg/dL   Protein, ur NEGATIVE NEGATIVE mg/dL   Nitrite NEGATIVE NEGATIVE   Leukocytes, UA NEGATIVE NEGATIVE    Comment: Performed at [facility] [address], [place], [place] [address]   No results found for this or any previous visit (from the past 240 hour(s)). Creatinine: Recent Labs    12/13/17 1336  CREATININE 1.01    Xrays: See report/chart none  Impression/Assessment:  Verbal consent for foley: failed 14 Fr straight and 16 Fr coude at level of sphincter;  Verbal consent for cysto: The patient was prepped and draped sterilely.  Flexible cystoscope demonstrated normal penile urethra.  The bulbar urethra looked normal.  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  There was no false passage. Verbal consent for balloon dilation at the bedside versus operating room: Utilizing excellent assistance I passed a sensor wire well prepared under cystoscopic guidance clinically bridging the stricture currently in his bladder.  I passed a 5 [place] open-ended ureteral catheter over the wire removing the wire.  He had clear urine return and leaked around the open-ended ureteral catheter.  I replaced the sensor wire removing the open-ended ureteral catheter.  Over the wire I passed a balloon dilation catheter bridging the stricture proven cystoscopically.  The balloon dilation catheter was dilated to 18 atm of pressure for 5 minutes 24 [place].  He tolerated the procedure exceptionally well.  The balloon dilation catheter was deflated and removed.  I cystoscoped the patient and there was  erythema and some blood in the area questions I did not scope beyond.  I easily passed a 39 [place] council catheter over the wire into the bladder.  There was high-volume clear urine return  Plan:  I gave the patient my phone number.  I like to leave the catheter in until next Wednesday.  He will come in for a trial of voiding Wednesday morning but call me Tuesday morning.  I thought it was best to send the urine for culture and empirically give him ciprofloxacin prescription.  Zakayla Martinec A 12/13/2017, 3:46 PM

## 2017-12-13 NOTE — ED Provider Notes (Signed)
Assumed care of patient at around 3 PM from the prior team.  I have reviewed their notes and agree with their history, physical, assessment and plan.  Patient is a 32 year old male with history of HIV, not currently treated, who presented with acute urinary retention.  They were unable to place a Foley here in the emergency department.  Urology was consulted for assistance.  Urology has seen the patient and was able to place a Foley catheter as well as perform balloon dilation.  He did have a urethral stricture.  Urology will follow up with the patient on Monday morning.  They have requested we discharge the patient on Flomax and Cipro.  Pt will also need to follow-up with infectious disease.  He was d/c in stable condition with an indwelling Foley catheter.   Lennette Bihari, MD 12/13/17 1704    Derwood Kaplan, MD 12/14/17 [phone]

## 2017-12-13 NOTE — ED Notes (Signed)
Urology placed foley catheter.

## 2017-12-13 NOTE — ED Notes (Signed)
Bladder scan showed 634 mL

## 2017-12-14 LAB — URINE CULTURE

## 2017-12-15 ENCOUNTER — Telehealth: Payer: Self-pay

## 2017-12-15 NOTE — Telephone Encounter (Signed)
Post ED Visit - Positive Culture Follow-up  Culture report reviewed by antimicrobial stewardship pharmacist:   Enzo Bi, Pharm.D.  Celedonio Miyamoto, Pharm.D., BCPS AQ-ID  Garvin Fila, Pharm.D., BCPS  Georgina Pillion, Pharm.D., BCPS  [place], [address].D., BCPS, AAHIVP  Estella Husk, Pharm.D., BCPS, AAHIVP  Lysle Pearl, PharmD, BCPS  Sherlynn Carbon, PharmD  Pollyann Samples, PharmD, BCPS [facility] Pharm D Positive urine culture Treated with Ciprofloxacin, organism sensitive to the same and no further patient follow-up is required at this time.  Jerry Caras 12/15/2017, 11:09 AM

## 2021-05-11 ENCOUNTER — Encounter (HOSPITAL_BASED_OUTPATIENT_CLINIC_OR_DEPARTMENT_OTHER): Payer: Self-pay

## 2021-05-11 ENCOUNTER — Emergency Department (HOSPITAL_BASED_OUTPATIENT_CLINIC_OR_DEPARTMENT_OTHER)
Admission: EM | Admit: 2021-05-11 | Discharge: 2021-05-11 | Disposition: A | Discharge status: Home / Self Care | Payer: 59 | Attending: Emergency Medicine | Admitting: Emergency Medicine

## 2021-05-11 ENCOUNTER — Other Ambulatory Visit: Payer: Self-pay

## 2021-05-11 DIAGNOSIS — Z21 Asymptomatic human immunodeficiency virus [HIV] infection status: Secondary | ICD-10-CM | POA: Diagnosis not present

## 2021-05-11 DIAGNOSIS — Z20822 Contact with and (suspected) exposure to covid-19: Secondary | ICD-10-CM | POA: Diagnosis not present

## 2021-05-11 DIAGNOSIS — J069 Acute upper respiratory infection, unspecified: Secondary | ICD-10-CM | POA: Insufficient documentation

## 2021-05-11 DIAGNOSIS — J1089 Influenza due to other identified influenza virus with other manifestations: Secondary | ICD-10-CM | POA: Insufficient documentation

## 2021-05-11 DIAGNOSIS — F17210 Nicotine dependence, cigarettes, uncomplicated: Secondary | ICD-10-CM | POA: Diagnosis not present

## 2021-05-11 DIAGNOSIS — R059 Cough, unspecified: Secondary | ICD-10-CM | POA: Diagnosis present

## 2021-05-11 LAB — RESP PANEL BY RT-PCR (FLU A&B, COVID) ARPGX2
Influenza A by PCR: POSITIVE — AB
Influenza B by PCR: NEGATIVE
SARS Coronavirus 2 by RT PCR: NEGATIVE

## 2021-05-11 MED ORDER — OSELTAMIVIR PHOSPHATE 75 MG PO CAPS: 75.0000 mg | ORAL_CAPSULE | Freq: Two times a day (BID) | ORAL | 0 refills | Status: DC

## 2021-05-11 MED ORDER — ACETAMINOPHEN 325 MG PO TABS
650.0000 mg | ORAL_TABLET | Freq: Once | ORAL | Status: AC
  Administered 2021-05-11: 650 mg via ORAL
  Filled 2021-05-11: qty 2

## 2021-05-11 NOTE — Discharge Instructions (Addendum)
Motrin and Tylenol as needed as directed for fevers and body aches. Recheck with your primary care provider.  Return to emergency room for worsening or concerning symptoms.  I prescribed you Tamiflu.  Please take this as prescribed.  If you develop any new or worsening symptoms please come back to the emergency department.  It was a pleasure to meet you.

## 2021-05-11 NOTE — ED Triage Notes (Signed)
Pt c/o flu like sx x today-NAD-steady gait

## 2021-05-11 NOTE — ED Provider Notes (Signed)
[facility] EMERGENCY DEPARTMENT Provider Note   CSN: [id number] Arrival date & time: 05/11/21  1856     History Chief Complaint  Patient presents with   Cough    Seth Carroll is a 35 y.o. male.  35 year old male with history of HIV (on Biktarvy, last labs reassuring) presents with complaint of fevers, body aches, cough and congestion.  Patient states that he woke up this morning feeling a little unwell and then symptoms became progressively worse throughout the day.  Patient had not taken Motrin and Tylenol until arrival in the ER.  No known sick contacts.  No other complaints or concerns.  No history of asthma or chronic lung disease.      Past Medical History:  Diagnosis Date   Abscess of neck 02/2015   HIV (human immunodeficiency virus infection) [facility])     Patient Active Problem List   Diagnosis Date Noted   HIV disease ([facility]) 03/02/2015   Tobacco abuse 03/02/2015   Neck pain    Neck abscess 02/28/2015    Past Surgical History:  Procedure Laterality Date   HERNIA REPAIR         No family history on file.  Social History   Tobacco Use   Smoking status: Every Day    Packs/day: 1.00    Years: 10.00    Pack years: 10.00    Types: Cigarettes   Smokeless tobacco: Never  Vaping Use   Vaping Use: Some days  Substance Use Topics   Alcohol use: Not Currently   Drug use: No    Home Medications Prior to Admission medications   Medication Sig Start Date End Date Taking? Authorizing Provider  doxycycline (VIBRAMYCIN) 100 MG capsule Take 1 capsule (100 mg total) by mouth 2 (two) times daily. One po bid x 7 days 01/09/17   Molpus, John, MD  tamsulosin (FLOMAX) 0.4 MG CAPS capsule Take 1 capsule (0.4 mg total) by mouth daily. 12/13/17   Bethel Born, PA-C    Allergies    Penicillins  Review of Systems   Review of Systems  Constitutional:  Positive for fever.  HENT:  Positive for congestion. Negative for ear pain and sore throat.   Respiratory:   Positive for cough.   Musculoskeletal:  Positive for arthralgias and myalgias.  Skin:  Negative for rash and wound.  Allergic/Immunologic: Positive for immunocompromised state.  Neurological:  Positive for headaches. Negative for weakness.  Hematological:  Negative for adenopathy.  All other systems reviewed and are negative.  Physical Exam Updated Vital Signs BP (!) 145/91 (BP Location: Left Arm)   Pulse (!) 108   Temp (!) 100.6 F (38.1 C) (Oral)   Resp 18   Ht 6\' 1"  (1.854 m)   Wt 108.4 kg   SpO2 95%   BMI 31.53 kg/m   Physical Exam Vitals and nursing note reviewed.  Constitutional:      General: He is not in acute distress.    Appearance: He is well-developed. He is not diaphoretic.  HENT:     Head: Normocephalic and atraumatic.     Right Ear: Tympanic membrane and ear canal normal.     Left Ear: Tympanic membrane and ear canal normal.     Nose: Congestion present.     Mouth/Throat:     Mouth: Mucous membranes are moist.     Pharynx: No oropharyngeal exudate or posterior oropharyngeal erythema.  Eyes:     Conjunctiva/sclera: Conjunctivae normal.  Cardiovascular:     Rate  and Rhythm: Normal rate and regular rhythm.     Heart sounds: Normal heart sounds.  Pulmonary:     Effort: Pulmonary effort is normal.     Breath sounds: Normal breath sounds.  Musculoskeletal:     Cervical back: Neck supple.  Lymphadenopathy:     Cervical: No cervical adenopathy.  Skin:    General: Skin is warm and dry.     Findings: No erythema or rash.  Neurological:     Mental Status: He is alert and oriented to person, place, and time.  Psychiatric:        Behavior: Behavior normal.    ED Results / Procedures / Treatments   Labs (all labs ordered are listed, but only abnormal results are displayed) Labs Reviewed  RESP PANEL BY RT-PCR (FLU A&B, COVID) [id number]    EKG None  Radiology No results found.  Procedures Procedures   Medications Ordered in ED Medications   acetaminophen (TYLENOL) tablet 650 mg (650 mg Oral Given 05/11/21 1910)    ED Course  I have reviewed the triage vital signs and the nursing notes.  Pertinent labs & imaging results that were available during my care of the patient were reviewed by me and considered in my medical decision making (see chart for details).  Clinical Course as of 05/11/21 1942  Thu May 11, 2021  [id number] 35 year old male with URI symptoms as above.  Febrile on arrival with temperature of 100.6, given Tylenol in triage.  COVID and flu swab obtained. Patient is well-appearing, mild nasal congestion otherwise exam is unremarkable.  O2 sat 95% on room air. Plan is to obtain COVID/flu swab.  Due to patient's HIV history, may be a candidate for Paxlovid (CMP 01/2021 with normal Cr) or Xofluza if swab is positive. [LM]    Clinical Course User Index [LM] Alden Hipp   MDM Rules/Calculators/A&P                           Final Clinical Impression(s) / ED Diagnoses Final diagnoses:  Viral URI with cough    Rx / DC Orders ED Discharge Orders     None        Alden Hipp 05/11/21 1942    Benjiman Core, MD 05/12/21 0003

## 2021-06-28 ENCOUNTER — Encounter (HOSPITAL_BASED_OUTPATIENT_CLINIC_OR_DEPARTMENT_OTHER): Payer: Self-pay | Admitting: *Deleted

## 2021-06-28 ENCOUNTER — Emergency Department (HOSPITAL_BASED_OUTPATIENT_CLINIC_OR_DEPARTMENT_OTHER): Payer: 59

## 2021-06-28 ENCOUNTER — Emergency Department (HOSPITAL_BASED_OUTPATIENT_CLINIC_OR_DEPARTMENT_OTHER)
Admission: EM | Admit: 2021-06-28 | Discharge: 2021-06-28 | Disposition: A | Discharge status: Home / Self Care | Payer: 59 | Attending: Emergency Medicine | Admitting: Emergency Medicine

## 2021-06-28 ENCOUNTER — Other Ambulatory Visit: Payer: Self-pay

## 2021-06-28 DIAGNOSIS — Z21 Asymptomatic human immunodeficiency virus [HIV] infection status: Secondary | ICD-10-CM | POA: Diagnosis not present

## 2021-06-28 DIAGNOSIS — R000 Tachycardia, unspecified: Secondary | ICD-10-CM | POA: Diagnosis not present

## 2021-06-28 DIAGNOSIS — R0602 Shortness of breath: Secondary | ICD-10-CM | POA: Insufficient documentation

## 2021-06-28 DIAGNOSIS — Z20822 Contact with and (suspected) exposure to covid-19: Secondary | ICD-10-CM | POA: Diagnosis not present

## 2021-06-28 DIAGNOSIS — F17210 Nicotine dependence, cigarettes, uncomplicated: Secondary | ICD-10-CM | POA: Insufficient documentation

## 2021-06-28 LAB — RESP PANEL BY RT-PCR (FLU A&B, COVID) ARPGX2
Influenza A by PCR: NEGATIVE
Influenza B by PCR: NEGATIVE
SARS Coronavirus 2 by RT PCR: NEGATIVE

## 2021-06-28 LAB — CBC WITH DIFFERENTIAL/PLATELET
Abs Immature Granulocytes: 0.01 10*3/uL (ref 0.00–0.07)
Basophils Absolute: 0 10*3/uL (ref 0.0–0.1)
Basophils Relative: 1 %
Eosinophils Absolute: 0.1 10*3/uL (ref 0.0–0.5)
Eosinophils Relative: 1 %
HCT: 44.7 % (ref 39.0–52.0)
Hemoglobin: 15.5 g/dL (ref 13.0–17.0)
Immature Granulocytes: 0 %
Lymphocytes Relative: 51 %
Lymphs Abs: 3.4 10*3/uL (ref 0.7–4.0)
MCH: 31.4 pg (ref 26.0–34.0)
MCHC: 34.7 g/dL (ref 30.0–36.0)
MCV: 90.5 fL (ref 80.0–100.0)
Monocytes Absolute: 0.4 10*3/uL (ref 0.1–1.0)
Monocytes Relative: 6 %
Neutro Abs: 2.7 10*3/uL (ref 1.7–7.7)
Neutrophils Relative %: 41 %
Platelets: 261 10*3/uL (ref 150–400)
RBC: 4.94 MIL/uL (ref 4.22–5.81)
RDW: 16.7 % — ABNORMAL HIGH (ref 11.5–15.5)
WBC: 6.6 10*3/uL (ref 4.0–10.5)
nRBC: 0 % (ref 0.0–0.2)

## 2021-06-28 LAB — COMPREHENSIVE METABOLIC PANEL
ALT: 24 U/L (ref 0–44)
AST: 23 U/L (ref 15–41)
Albumin: 4.4 g/dL (ref 3.5–5.0)
Alkaline Phosphatase: 61 U/L (ref 38–126)
Anion gap: 12 (ref 5–15)
BUN: 15 mg/dL (ref 6–20)
CO2: 19 mmol/L — ABNORMAL LOW (ref 22–32)
Calcium: 9.1 mg/dL (ref 8.9–10.3)
Chloride: 107 mmol/L (ref 98–111)
Creatinine, Ser: 1.01 mg/dL (ref 0.61–1.24)
GFR, Estimated: >60 mL/min (ref >60)
Glucose, Bld: 110 mg/dL — ABNORMAL HIGH (ref 70–99)
Potassium: 4.1 mmol/L (ref 3.5–5.1)
Sodium: 138 mmol/L (ref 135–145)
Total Bilirubin: 0.3 mg/dL (ref 0.3–1.2)
Total Protein: 7.8 g/dL (ref 6.5–8.1)

## 2021-06-28 LAB — D-DIMER, QUANTITATIVE: D-Dimer, Quant: <0.27 ug/mL-FEU (ref 0.00–0.50)

## 2021-06-28 LAB — TROPONIN I (HIGH SENSITIVITY)
Troponin I (High Sensitivity): 5 ng/L (ref <18)
Troponin I (High Sensitivity): 5 ng/L (ref <18)

## 2021-06-28 MED ORDER — DEXAMETHASONE SODIUM PHOSPHATE 10 MG/ML IJ SOLN
10.0000 mg | Freq: Once | INTRAMUSCULAR | Status: AC
  Administered 2021-06-28: 10 mg via INTRAVENOUS
  Filled 2021-06-28: qty 1

## 2021-06-28 MED ORDER — AEROCHAMBER PLUS FLO-VU MEDIUM MISC
1.0000 | Freq: Once | Status: DC
  Filled 2021-06-28: qty 1

## 2021-06-28 MED ORDER — NICOTINE 21 MG/24HR TD PT24
21.0000 mg | MEDICATED_PATCH | Freq: Once | TRANSDERMAL | Status: DC
  Filled 2021-06-28: qty 1

## 2021-06-28 MED ORDER — NICOTINE 14 MG/24HR TD PT24: 14.0000 mg | MEDICATED_PATCH | Freq: Every day | TRANSDERMAL | 0 refills | Status: DC

## 2021-06-28 MED ORDER — ALBUTEROL SULFATE HFA 108 (90 BASE) MCG/ACT IN AERS
4.0000 | INHALATION_SPRAY | Freq: Once | RESPIRATORY_TRACT | Status: DC
  Filled 2021-06-28: qty 6.7

## 2021-06-28 MED ORDER — IPRATROPIUM-ALBUTEROL 0.5-2.5 (3) MG/3ML IN SOLN
3.0000 mL | Freq: Once | RESPIRATORY_TRACT | Status: AC
  Administered 2021-06-28: 3 mL via RESPIRATORY_TRACT
  Filled 2021-06-28: qty 3

## 2021-06-28 NOTE — Discharge Instructions (Addendum)
You were seen in ER today for shortness of breath.  Your physical exam, vital signs, blood work, and chest x-ray were very reassuring.  You likely have some inflammation and irritation in your airways secondary to your recent viral illnesses, your dusty environment at work and your smoking.  Your symptoms improved after breathing treatments in the emergency department.  You have been given a steroid while in the ER to help with your inflammation over the next few days.  You have been given an albuterol inhaler to use as needed for shortness of breath.  Please follow-up with your primary care doctor and return to the ER with any difficulty breathing, nausea or vomiting that does not stop, or any other concerning symptom.

## 2021-06-28 NOTE — ED Notes (Signed)
Called to triage for RT assessment.

## 2021-06-28 NOTE — ED Notes (Signed)
Patient transported to X-ray 

## 2021-06-28 NOTE — ED Notes (Addendum)
Documented wrong chart

## 2021-06-28 NOTE — ED Notes (Signed)
PA at bedside, unable to obtain vitals at this time.

## 2021-06-28 NOTE — ED Provider Notes (Signed)
[facility] EMERGENCY DEPARTMENT Provider Note   CSN: [id number] Arrival date & time: 06/28/21  1505     History Chief Complaint  Patient presents with   Shortness of Breath    Seth Carroll is a 35 y.o. male with history of HIV compliant with his Biktarvy who presents with concern for 3 days of progressive worsening shortness of breath particular with exertion.  Recently had flu 3 weeks ago, 2 weeks ago had COVID.  Smokes 1 pack of cigarettes per day and has for many years.  Denies any recent travel, prolonged immobilization, history of clot, malignancy, or hormone replacement therapy.  He denies any known history of asthma or COPD.  Never had an inhaler in the past.  I have personally reviewed his medical records.  He is not on any medications aside from his [place] daily.  HPI     Past Medical History:  Diagnosis Date   Abscess of neck 02/2015   HIV (human immunodeficiency virus infection) [facility])     Patient Active Problem List   Diagnosis Date Noted   HIV disease (HCC) 03/02/2015   Tobacco abuse 03/02/2015   Neck pain    Neck abscess 02/28/2015    Past Surgical History:  Procedure Laterality Date   HERNIA REPAIR         History reviewed. No pertinent family history.  Social History   Tobacco Use   Smoking status: Every Day    Packs/day: 1.00    Years: 10.00    Pack years: 10.00    Types: Cigarettes   Smokeless tobacco: Never  Vaping Use   Vaping Use: Some days  Substance Use Topics   Alcohol use: Not Currently   Drug use: No    Home Medications Prior to Admission medications   Medication Sig Start Date End Date Taking? Authorizing Provider  BIKTARVY 50-200-25 MG TABS tablet Take 1 tablet by mouth daily. 05/08/21   [provider]  doxycycline (VIBRAMYCIN) 100 MG capsule Take 1 capsule (100 mg total) by mouth 2 (two) times daily. One po bid x 7 days 01/09/17   Molpus, John, MD  nicotine (NICODERM CQ - DOSED IN MG/24 HOURS) 14  mg/24hr patch Place 1 patch (14 mg total) onto the skin daily. 06/28/21   Gillian Kluever, Eugene Gavia, PA-C  oseltamivir (TAMIFLU) 75 MG capsule Take 1 capsule (75 mg total) by mouth every 12 (twelve) hours. 05/11/21   Placido Sou, PA-C    Allergies    Penicillins  Review of Systems   Review of Systems  Constitutional:  Positive for activity change, appetite change, chills and fatigue.  HENT:  Positive for congestion and ear pain. Negative for sore throat.   Eyes: Negative.   Respiratory:  Positive for cough. Negative for shortness of breath.   Cardiovascular: Negative.   Gastrointestinal:  Positive for nausea and vomiting. Negative for diarrhea.  Musculoskeletal:  Positive for myalgias.  Skin: Negative.   Neurological:  Positive for headaches. Negative for facial asymmetry and light-headedness.   Physical Exam Updated Vital Signs BP 136/87   Pulse 67   Temp 98.4 F (36.9 C) (Oral)   Resp 20   Ht 6\' 1"  (1.854 m)   Wt 102.1 kg   SpO2 97%   BMI 29.69 kg/m   Physical Exam Vitals and nursing note reviewed.  Constitutional:      General: He is not in acute distress.    Appearance: He is not toxic-appearing.  HENT:     Head: Normocephalic  and atraumatic.     Right Ear: Tympanic membrane normal.     Left Ear: Tympanic membrane normal.     Nose: Nose normal.     Mouth/Throat:     Mouth: Mucous membranes are moist. No oral lesions.     Pharynx: Oropharynx is clear. Uvula midline. No oropharyngeal exudate or posterior oropharyngeal erythema.     Tonsils: No tonsillar exudate.  Eyes:     General: Lids are normal. Vision grossly intact.        Right eye: No discharge.        Left eye: No discharge.     Extraocular Movements: Extraocular movements intact.     Conjunctiva/sclera: Conjunctivae normal.     Pupils: Pupils are equal, round, and reactive to light.  Neck:     Trachea: Trachea and phonation normal.  Cardiovascular:     Rate and Rhythm: Normal rate and regular rhythm.      Pulses: Normal pulses.     Heart sounds: Normal heart sounds. No murmur heard. Pulmonary:     Effort: Pulmonary effort is normal. Tachypnea present. No bradypnea, accessory muscle usage, prolonged expiration or respiratory distress.     Breath sounds: Examination of the right-middle field reveals decreased breath sounds. Examination of the left-middle field reveals decreased breath sounds. Examination of the right-lower field reveals decreased breath sounds. Examination of the left-lower field reveals decreased breath sounds. Decreased breath sounds present. No wheezing or rales.     Comments: Notable dyspnea with speech or with any ambulation. Chest:     Chest wall: No mass, lacerations, deformity, swelling, tenderness, crepitus or edema.  Abdominal:     General: Bowel sounds are normal. There is no distension.     Palpations: Abdomen is soft.     Tenderness: There is no abdominal tenderness. There is no right CVA tenderness or left CVA tenderness.  Musculoskeletal:        General: No deformity.     Cervical back: Normal range of motion and neck supple. No edema, rigidity or crepitus. No pain with movement, spinous process tenderness or muscular tenderness.     Right lower leg: No edema.     Left lower leg: No edema.  Lymphadenopathy:     Cervical: No cervical adenopathy.  Skin:    General: Skin is warm and dry.     Capillary Refill: Capillary refill takes less than 2 seconds.     Findings: No rash.  Neurological:     Mental Status: He is alert. Mental status is at baseline.     GCS: GCS eye subscore is 4. GCS verbal subscore is 5. GCS motor subscore is 6.     Gait: Gait is intact. Gait normal.  Psychiatric:        Mood and Affect: Mood normal.    ED Results / Procedures / Treatments   Labs (all labs ordered are listed, but only abnormal results are displayed) Labs Reviewed  CBC WITH DIFFERENTIAL/PLATELET - Abnormal; Notable for the following components:      Result Value    RDW 16.7 (*)    All other components within normal limits  COMPREHENSIVE METABOLIC PANEL - Abnormal; Notable for the following components:   CO2 19 (*)    Glucose, Bld 110 (*)    All other components within normal limits  RESP PANEL BY RT-PCR (FLU A&B, COVID) ARPGX2  D-DIMER, QUANTITATIVE  TROPONIN I (HIGH SENSITIVITY)  TROPONIN I (HIGH SENSITIVITY)    EKG EKG Interpretation  Date/Time:  Wednesday June 28 2021 15:21:53 EST Ventricular Rate:  118 PR Interval:  126 QRS Duration: 94 QT Interval:  324 QTC Calculation: 454 R Axis:   87 Text Interpretation: Sinus tachycardia Minimal voltage criteria for LVH, may be normal variant ( Sokolow-Lyon ) Borderline ECG No prior ECG for comparison. Possible S1Q3T3 pattern No STEMI Confirmed by Theda Belfast ([id number]) on 06/28/2021 3:49:04 PM  Radiology DG Chest 2 View  Result Date: 06/28/2021 CLINICAL DATA:  Shortness of breath EXAM: CHEST - 2 VIEW COMPARISON:  March 27, 2016 FINDINGS: The heart size and mediastinal contours are within normal limits. No focal consolidation. No pleural effusion. No pneumothorax. The visualized skeletal structures are unremarkable. IMPRESSION: No active cardiopulmonary disease. Electronically Signed   By: Maudry Mayhew M.D.   On: 06/28/2021 15:43    Procedures Procedures   Medications Ordered in ED Medications  albuterol (VENTOLIN HFA) 108 (90 Base) MCG/ACT inhaler 4 puff (has no administration in time range)  AeroChamber Plus Flo-Vu Medium MISC 1 each (has no administration in time range)  nicotine (NICODERM CQ - dosed in mg/24 hours) patch 21 mg (21 mg Transdermal Not Given 06/28/21 2029)  ipratropium-albuterol (DUONEB) 0.5-2.5 (3) MG/3ML nebulizer solution 3 mL (3 mLs Nebulization Given 06/28/21 1959)  dexamethasone (DECADRON) injection 10 mg (10 mg Intravenous Given 06/28/21 2026)    ED Course  I have reviewed the triage vital signs and the nursing notes.  Pertinent labs & imaging results that were  available during my care of the patient were reviewed by me and considered in my medical decision making (see chart for details).    MDM Rules/Calculators/A&P                         35 year old male who presents with concern for shortness of breath.  Differential includes present limited to PE, pleural effusion, ACS, dysrhythmia, COPD exacerbation, asthma, CHF, pneumonia, pleural effusion.  Tachycardic to the 120s and tachypneic and hypertensive on intake.  Cardiopulmonary exam revealed decreased air movement throughout the lung fields bilaterally.  No dyspnea with speech or any ambulation.  He is not tachycardic at time of my exam.  Abdominal exam and HEENT exams are unremarkable.  Patient is neurovascular intact in all 4 extremities.  CBC unremarkable, CMP unremarkable  respiratory pathogen panel panel negative at this time.  Troponin initially negative, 5.  EKG with S1Q3T3 changes.  D-dimer obtained which was negative, delta troponin also negative, 5.  Patient administered steroid and DuoNeb with significant improvement in his symptoms.  Subsequently administered 8 puffs of albuterol by MDI with complete resolution of his chest tightness.  Maintaining his oxygen saturation with ambulation without difficulty.  No further work-up warranted at this time.  Gurman voiced understanding with medical evaluation and treatment plan.  Each of his questions answered to his expressed satisfaction.  Return precautions are given.  Patient is well-appearing, stable, and appropriate for discharge at this time.  This chart was dictated using voice recognition software, Dragon. Despite the best efforts of this provider to proofread and correct errors, errors may still occur which can change documentation meaning.  Final Clinical Impression(s) / ED Diagnoses Final diagnoses:  Shortness of breath    Rx / DC Orders ED Discharge Orders          Ordered    nicotine (NICODERM CQ - DOSED IN MG/24 HOURS) 14  mg/24hr patch  Daily,   Status:  Discontinued  06/28/21 2022    nicotine (NICODERM CQ - DOSED IN MG/24 HOURS) 14 mg/24hr patch  Daily        06/28/21 2051             Sherrilee Gilles 06/28/21 2137    Tegeler, Canary Brim, MD 06/29/21 [phone]

## 2021-06-28 NOTE — ED Notes (Signed)
Ambulated patient via PA order.Resting HR 70/ RR 16/ Oxygen saturation 98%. Patient maintained a normal gait . Slight increase in SOB. HR increased to 87 oxygen saturation maintained at 97%. Patient returned to bed placed back on monitor. Patient tolerated well.

## 2021-06-30 ENCOUNTER — Encounter (HOSPITAL_BASED_OUTPATIENT_CLINIC_OR_DEPARTMENT_OTHER): Payer: Self-pay | Admitting: Emergency Medicine

## 2021-06-30 ENCOUNTER — Emergency Department (HOSPITAL_BASED_OUTPATIENT_CLINIC_OR_DEPARTMENT_OTHER)
Admission: EM | Admit: 2021-06-30 | Discharge: 2021-06-30 | Disposition: A | Discharge status: Home / Self Care | Payer: 59 | Attending: Emergency Medicine | Admitting: Emergency Medicine

## 2021-06-30 ENCOUNTER — Other Ambulatory Visit: Payer: Self-pay

## 2021-06-30 DIAGNOSIS — W260XXA Contact with knife, initial encounter: Secondary | ICD-10-CM | POA: Diagnosis not present

## 2021-06-30 DIAGNOSIS — Y93G1 Activity, food preparation and clean up: Secondary | ICD-10-CM | POA: Diagnosis not present

## 2021-06-30 DIAGNOSIS — Z23 Encounter for immunization: Secondary | ICD-10-CM | POA: Insufficient documentation

## 2021-06-30 DIAGNOSIS — Z21 Asymptomatic human immunodeficiency virus [HIV] infection status: Secondary | ICD-10-CM | POA: Diagnosis not present

## 2021-06-30 DIAGNOSIS — F17210 Nicotine dependence, cigarettes, uncomplicated: Secondary | ICD-10-CM | POA: Insufficient documentation

## 2021-06-30 DIAGNOSIS — S61411A Laceration without foreign body of right hand, initial encounter: Secondary | ICD-10-CM | POA: Diagnosis present

## 2021-06-30 MED ORDER — LIDOCAINE HCL (PF) 1 % IJ SOLN
10.0000 mL | Freq: Once | INTRAMUSCULAR | Status: AC
  Administered 2021-06-30: 10 mL
  Filled 2021-06-30: qty 10

## 2021-06-30 MED ORDER — TETANUS-DIPHTH-ACELL PERTUSSIS 5-2.5-18.5 LF-MCG/0.5 IM SUSY
0.5000 mL | PREFILLED_SYRINGE | Freq: Once | INTRAMUSCULAR | Status: AC
  Administered 2021-06-30: 0.5 mL via INTRAMUSCULAR
  Filled 2021-06-30: qty 0.5

## 2021-06-30 NOTE — ED Triage Notes (Signed)
Was washing dishes and cut palm of  rt palm, UKN last tetanus

## 2021-06-30 NOTE — ED Provider Notes (Signed)
[facility] EMERGENCY DEPARTMENT Provider Note   CSN: [id number] Arrival date & time: 06/30/21  1723     History Chief Complaint  Patient presents with   Hand Injury    Seth Carroll is a 35 y.o. male past medical history who presents with laceration to the right volar surface of the palm overlying the thenar eminence.  Patient reports that he was cleaning dishes when he sliced the hand on a knife that was hidden in the soapy water.  Patient reports it bled for a few minutes, before bleeding was controlled.  Patient reports minimal pain at this time, just some light stinging.  Patient reports injury feels worse with movement, better with resting the hand.  Patient has not taken anything for pain at this time.  Patient reports he does not know when his last tetanus shot was.  Patient is notably HIV positive.   Hand Injury     Past Medical History:  Diagnosis Date   Abscess of neck 02/2015   HIV (human immunodeficiency virus infection) [facility])     Patient Active Problem List   Diagnosis Date Noted   HIV disease (HCC) 03/02/2015   Tobacco abuse 03/02/2015   Neck pain    Neck abscess 02/28/2015    Past Surgical History:  Procedure Laterality Date   HERNIA REPAIR         No family history on file.  Social History   Tobacco Use   Smoking status: Every Day    Packs/day: 1.00    Years: 10.00    Pack years: 10.00    Types: Cigarettes   Smokeless tobacco: Never  Vaping Use   Vaping Use: Some days  Substance Use Topics   Alcohol use: Not Currently   Drug use: No    Home Medications Prior to Admission medications   Medication Sig Start Date End Date Taking? Authorizing Provider  BIKTARVY 50-200-25 MG TABS tablet Take 1 tablet by mouth daily. 05/08/21   [provider]  doxycycline (VIBRAMYCIN) 100 MG capsule Take 1 capsule (100 mg total) by mouth 2 (two) times daily. One po bid x 7 days 01/09/17   Molpus, John, MD  nicotine (NICODERM CQ - DOSED IN  MG/24 HOURS) 14 mg/24hr patch Place 1 patch (14 mg total) onto the skin daily. 06/28/21   Sponseller, Eugene Gavia, PA-C  oseltamivir (TAMIFLU) 75 MG capsule Take 1 capsule (75 mg total) by mouth every 12 (twelve) hours. 05/11/21   Placido Sou, PA-C    Allergies    Penicillins  Review of Systems   Review of Systems  Skin:  Positive for wound.  All other systems reviewed and are negative.  Physical Exam Updated Vital Signs BP 115/79   Pulse 92   Temp 98.5 F (36.9 C) (Oral)   Resp 16   Ht 6\' 1"  (1.854 m)   Wt 102.9 kg   SpO2 100%   BMI 29.93 kg/m   Physical Exam Vitals and nursing note reviewed.  Constitutional:      General: He is not in acute distress.    Appearance: Normal appearance.  HENT:     Head: Normocephalic and atraumatic.  Eyes:     General:        Right eye: No discharge.        Left eye: No discharge.  Cardiovascular:     Rate and Rhythm: Normal rate and regular rhythm.     Pulses: Normal pulses.     Comments: Intact radial and  ulnar pulses of the right wrist. Pulmonary:     Effort: Pulmonary effort is normal. No respiratory distress.  Musculoskeletal:        General: No deformity.     Comments: Intact strength 5 out of 5 to flexion extension of all digits including thumb of affected hand  Skin:    General: Skin is warm and dry.     Capillary Refill: Capillary refill takes less than 2 seconds.     Comments: Approximately 2 and half centimeter laceration overlying the volar aspect of the thenar eminence on the right palm.  Full wound length is visualized,, no evidence of tendon damage, retained foreign body.  There is no active bleeding at time my examination.  Neurological:     Mental Status: He is alert and oriented to person, place, and time.  Psychiatric:        Mood and Affect: Mood normal.        Behavior: Behavior normal.    ED Results / Procedures / Treatments   Labs (all labs ordered are listed, but only abnormal results are  displayed) Labs Reviewed - No data to display  EKG None  Radiology No results found.  Procedures .Marland KitchenLaceration Repair  Date/Time: 06/30/2021 7:59 PM Performed by: Olene Floss, PA-C Authorized by: Olene Floss, PA-C   Consent:    Consent obtained:  Verbal   Consent given by:  Patient   Risks, benefits, and alternatives were discussed: yes     Risks discussed:  Pain, infection and poor wound healing   Alternatives discussed:  No treatment Universal protocol:    Procedure explained and questions answered to patient or proxy's satisfaction: yes     Patient identity confirmed:  Verbally with patient Anesthesia:    Anesthesia method:  Local infiltration   Local anesthetic:  Lidocaine 1% w/o epi Laceration details:    Location:  Hand   Hand location:  R palm   Length (cm):  2.5   Depth (mm):  3 Exploration:    Wound exploration: entire depth of wound visualized   Treatment:    Area cleansed with:  Povidone-iodine and saline   Amount of cleaning:  Standard   Irrigation solution:  Sterile saline Skin repair:    Repair method:  Sutures   Suture size:  4-0   Suture material:  Chromic gut   Suture technique:  Simple interrupted   Number of sutures:  4 Approximation:    Approximation:  Close Repair type:    Repair type:  Simple Post-procedure details:    Dressing:  Antibiotic ointment and non-adherent dressing   Procedure completion:  Tolerated   Medications Ordered in ED Medications  lidocaine (PF) (XYLOCAINE) 1 % injection 10 mL (10 mLs Infiltration Given by Other 06/30/21 1812)  Tdap (BOOSTRIX) injection 0.5 mL (0.5 mLs Intramuscular Given 06/30/21 1757)    ED Course  I have reviewed the triage vital signs and the nursing notes.  Pertinent labs & imaging results that were available during my care of the patient were reviewed by me and considered in my medical decision making (see chart for details).  Clinical Course as of 06/30/21 1928  Fri Jun 30, 2021  1837 2.5cm 4 sutures [CP]    Clinical Course User Index [CP] Olene Floss, [place]   MDM Rules/Calculators/A&P                         Overall well-appearing patient presents with simple  linear laceration to the volar aspect of the right palm overlying the thenar eminence.  Patient has no evidence of tendinous injury to the affected digit.  He has full range of motion with intact flexion extension of the right thumb.  He does have some pain with flexion, however full length of wound visualized, no evidence of tendon damage noted.  There is no evidence of foreign body.  Laceration was repaired as described above.  Bandaged with bacitracin, and nonadherent dressing.  Patient encouraged to bandage at home with same.  Discussed with patient that he used dissolvable sutures, however he can return in 7 to 10 days for suture removal since they will take on the order of months to dissolve on their own.  Discussed return precautions including increasing redness, tenderness, purulent drainage.  Patient discharged in stable condition. Final Clinical Impression(s) / ED Diagnoses Final diagnoses:  Laceration of right hand without foreign body, initial encounter    Rx / DC Orders ED Discharge Orders     None        [place] 06/30/21 2003    Dykstra, Richard S, MD 07/01/21 Ernestina [place]

## 2021-06-30 NOTE — Discharge Instructions (Signed)
As we discussed today I recommend that you bandage the area at least once daily with an antibacterial ointment like Polysporin.  Please return in 7 to 10 days for suture removal, although as we discussed the sutures that are used are dissolvable so if you do not return they will eventually dissolve on their own.

## 2021-06-30 NOTE — ED Notes (Signed)
No acute distress noted upon this RN's departure of patient. Verified discharge paperwork with name and DOB. Wound cleaned and dressed prior to discharge. Patient taken to checkout window. Discharge paperwork discussed with patient. No further questions voiced upon discharge.

## 2021-07-03 ENCOUNTER — Other Ambulatory Visit: Payer: Self-pay

## 2021-07-03 ENCOUNTER — Encounter (HOSPITAL_BASED_OUTPATIENT_CLINIC_OR_DEPARTMENT_OTHER): Payer: Self-pay

## 2021-07-03 ENCOUNTER — Emergency Department (HOSPITAL_BASED_OUTPATIENT_CLINIC_OR_DEPARTMENT_OTHER)
Admission: EM | Admit: 2021-07-03 | Discharge: 2021-07-03 | Disposition: A | Discharge status: Home / Self Care | Payer: 59 | Attending: Emergency Medicine | Admitting: Emergency Medicine

## 2021-07-03 DIAGNOSIS — Z21 Asymptomatic human immunodeficiency virus [HIV] infection status: Secondary | ICD-10-CM | POA: Insufficient documentation

## 2021-07-03 DIAGNOSIS — U071 COVID-19: Secondary | ICD-10-CM | POA: Diagnosis not present

## 2021-07-03 DIAGNOSIS — J069 Acute upper respiratory infection, unspecified: Secondary | ICD-10-CM

## 2021-07-03 DIAGNOSIS — Z87891 Personal history of nicotine dependence: Secondary | ICD-10-CM | POA: Diagnosis not present

## 2021-07-03 DIAGNOSIS — J329 Chronic sinusitis, unspecified: Secondary | ICD-10-CM | POA: Insufficient documentation

## 2021-07-03 DIAGNOSIS — R059 Cough, unspecified: Secondary | ICD-10-CM | POA: Diagnosis present

## 2021-07-03 MED ORDER — PREDNISONE 20 MG PO TABS: 40.0000 mg | ORAL_TABLET | Freq: Every day | ORAL | 0 refills | Status: DC

## 2021-07-03 MED ORDER — AZITHROMYCIN 250 MG PO TABS: 250.0000 mg | ORAL_TABLET | Freq: Every day | ORAL | 0 refills | Status: DC

## 2021-07-03 NOTE — ED Provider Notes (Signed)
[facility] EMERGENCY DEPARTMENT Provider Note   CSN: [id number] Arrival date & time: 07/03/21  1628     History Chief Complaint  Patient presents with   Shortness of Breath    Seth Carroll is a 35 y.o. male.   Shortness of Breath Associated symptoms: cough   Associated symptoms: no abdominal pain and no rash   Patient presents with URI symptoms.  Had COVID and flu recently.  Been feeling a little better and got worse.  Had been seen in the ER 5 days ago for URI symptoms.  Negative D-dimer that time.  Still continued shortness of breath.  States he has to take time off work.  Occasional cough but not really bringing much up.  States now his sinuses are more congested.  States he feels the feel wheeze 2.  Has been given a steroids and inhaler previously.  States that he felt better for couple days now feeling worse again.  Has known HIV but reassuring CD4 count and negative viral load.  Past Medical History:  Diagnosis Date   Abscess of neck 02/2015   HIV (human immunodeficiency virus infection) [facility])     Patient Active Problem List   Diagnosis Date Noted   HIV disease (HCC) 03/02/2015   Tobacco abuse 03/02/2015   Neck pain    Neck abscess 02/28/2015    Past Surgical History:  Procedure Laterality Date   HERNIA REPAIR         No family history on file.  Social History   Tobacco Use   Smoking status: Former    Packs/day: 1.00    Years: 10.00    Pack years: 10.00    Types: Cigarettes    Quit date: 06/26/2021    Years since quitting: 0.0   Smokeless tobacco: Never  Vaping Use   Vaping Use: Former  Substance Use Topics   Alcohol use: Not Currently   Drug use: No    Home Medications Prior to Admission medications   Medication Sig Start Date End Date Taking? Authorizing Provider  azithromycin (ZITHROMAX) 250 MG tablet Take 1 tablet (250 mg total) by mouth daily. Take first 2 tablets together, then 1 every day until finished. 07/03/21  Yes Benjiman Core, MD  predniSONE (DELTASONE) 20 MG tablet Take 2 tablets (40 mg total) by mouth daily. 07/03/21  Yes Benjiman Core, MD  BIKTARVY 50-200-25 MG TABS tablet Take 1 tablet by mouth daily. 05/08/21   [provider]  doxycycline (VIBRAMYCIN) 100 MG capsule Take 1 capsule (100 mg total) by mouth 2 (two) times daily. One po bid x 7 days 01/09/17   Molpus, John, MD  nicotine (NICODERM CQ - DOSED IN MG/24 HOURS) 14 mg/24hr patch Place 1 patch (14 mg total) onto the skin daily. 06/28/21   Sponseller, Eugene Gavia, PA-C  oseltamivir (TAMIFLU) 75 MG capsule Take 1 capsule (75 mg total) by mouth every 12 (twelve) hours. 05/11/21   Placido Sou, PA-C    Allergies    Penicillins  Review of Systems   Review of Systems  Constitutional:  Positive for fatigue. Negative for appetite change.  HENT:  Positive for congestion and sinus pain.   Respiratory:  Positive for cough and shortness of breath.   Gastrointestinal:  Negative for abdominal pain.  Genitourinary:  Negative for flank pain.  Musculoskeletal:  Negative for back pain.  Skin:  Negative for rash.  Neurological:  Negative for weakness.  Psychiatric/Behavioral:  Negative for confusion.    Physical Exam Updated  Vital Signs BP 132/81 (BP Location: Left Arm)   Pulse (!) 102   Temp 98.4 F (36.9 C) (Oral)   Resp 20   Ht 6\' 1"  (1.854 m)   Wt 104.3 kg   SpO2 99%   BMI 30.34 kg/m   Physical Exam Vitals and nursing note reviewed.  HENT:     Head:     Comments: Some tenderness over sinuses. Cardiovascular:     Rate and Rhythm: Normal rate and regular rhythm.  Pulmonary:     Breath sounds: No wheezing, rhonchi or rales.  Chest:     Chest wall: No tenderness.  Abdominal:     Tenderness: There is no abdominal tenderness.  Musculoskeletal:     Cervical back: Neck supple.     Right lower leg: No edema.     Left lower leg: No edema.  Skin:    Capillary Refill: Capillary refill takes less than 2 seconds.  Neurological:      Mental Status: He is alert.    ED Results / Procedures / Treatments   Labs (all labs ordered are listed, but only abnormal results are displayed) Labs Reviewed  RESP PANEL BY RT-PCR (FLU A&B, COVID) ARPGX2 - Abnormal; Notable for the following components:      Result Value   SARS Coronavirus 2 by RT PCR POSITIVE (*)    All other components within normal limits    EKG None  Radiology No results found.  Procedures Procedures   Medications Ordered in ED Medications - No data to display  ED Course  I have reviewed the triage vital signs and the nursing notes.  Pertinent labs & imaging results that were available during my care of the patient were reviewed by me and considered in my medical decision making (see chart for details).    MDM Rules/Calculators/A&P                           Patient with recent COVID and flu.  COVID test had been negative but now positive again.  Chest x-ray reviewed from 5 days ago.  Lungs clear.  Unsure why this test has become positive now but could be he just has a little level of viral load even/long COVID.  However with new sinus symptoms and congestion will treat for potential sinus infection with some antibiotics.  We will also give some steroid since he felt better with less steroids.  Will discharge home Final Clinical Impression(s) / ED Diagnoses Final diagnoses:  Upper respiratory tract infection, unspecified type  Sinusitis, unspecified chronicity, unspecified location    Rx / DC Orders ED Discharge Orders          Ordered    predniSONE (DELTASONE) 20 MG tablet  Daily        07/03/21 1937    azithromycin (ZITHROMAX) 250 MG tablet  Daily        07/03/21 1937             14/12/22, MD 07/03/21 [phone]

## 2021-07-03 NOTE — Progress Notes (Signed)
RT listened to patient in the waiting area. BBS are clear and diminished. Patient's O2 level was 100 on room air.

## 2021-07-03 NOTE — ED Triage Notes (Signed)
Pt arrives with c/o worsening SOB over the last week. RT evaluated in waiting room - see note. Pt has had some fevers at home and sinus pressure. Has had a positive covid test followed by a negative covid test..

## 2021-07-04 LAB — RESP PANEL BY RT-PCR (FLU A&B, COVID) ARPGX2
Influenza A by PCR: NEGATIVE
Influenza B by PCR: NEGATIVE
SARS Coronavirus 2 by RT PCR: POSITIVE — AB

## 2022-06-18 ENCOUNTER — Emergency Department (HOSPITAL_BASED_OUTPATIENT_CLINIC_OR_DEPARTMENT_OTHER): Payer: 59

## 2022-06-18 ENCOUNTER — Other Ambulatory Visit: Payer: Self-pay

## 2022-06-18 ENCOUNTER — Emergency Department (HOSPITAL_BASED_OUTPATIENT_CLINIC_OR_DEPARTMENT_OTHER)
Admission: EM | Admit: 2022-06-18 | Discharge: 2022-06-18 | Disposition: A | Discharge status: Home / Self Care | Payer: 59 | Attending: Emergency Medicine | Admitting: Emergency Medicine

## 2022-06-18 ENCOUNTER — Encounter (HOSPITAL_BASED_OUTPATIENT_CLINIC_OR_DEPARTMENT_OTHER): Payer: Self-pay | Admitting: Emergency Medicine

## 2022-06-18 DIAGNOSIS — U071 COVID-19: Secondary | ICD-10-CM

## 2022-06-18 DIAGNOSIS — J039 Acute tonsillitis, unspecified: Secondary | ICD-10-CM

## 2022-06-18 DIAGNOSIS — R059 Cough, unspecified: Secondary | ICD-10-CM | POA: Diagnosis present

## 2022-06-18 DIAGNOSIS — H66003 Acute suppurative otitis media without spontaneous rupture of ear drum, bilateral: Secondary | ICD-10-CM

## 2022-06-18 DIAGNOSIS — Z21 Asymptomatic human immunodeficiency virus [HIV] infection status: Secondary | ICD-10-CM | POA: Diagnosis not present

## 2022-06-18 LAB — RESP PANEL BY RT-PCR (FLU A&B, COVID) ARPGX2
Influenza A by PCR: NEGATIVE
Influenza B by PCR: NEGATIVE
SARS Coronavirus 2 by RT PCR: POSITIVE — AB

## 2022-06-18 LAB — GROUP A STREP BY PCR: Group A Strep by PCR: NOT DETECTED

## 2022-06-18 MED ORDER — ONDANSETRON 4 MG PO TBDP: 4.0000 mg | ORAL_TABLET | Freq: Three times a day (TID) | ORAL | 0 refills | Status: DC | PRN

## 2022-06-18 MED ORDER — CLINDAMYCIN HCL 150 MG PO CAPS: 300.0000 mg | ORAL_CAPSULE | Freq: Three times a day (TID) | ORAL | 0 refills | Status: AC

## 2022-06-18 MED ORDER — MOLNUPIRAVIR EUA 200MG CAPSULE: 4.0000 | ORAL_CAPSULE | Freq: Two times a day (BID) | ORAL | 0 refills | Status: DC

## 2022-06-18 MED ORDER — BENZONATATE 100 MG PO CAPS: 100.0000 mg | ORAL_CAPSULE | Freq: Three times a day (TID) | ORAL | 0 refills | Status: AC

## 2022-06-18 MED ORDER — ONDANSETRON 4 MG PO TBDP: 4.0000 mg | ORAL_TABLET | Freq: Three times a day (TID) | ORAL | 0 refills | Status: AC | PRN

## 2022-06-18 MED ORDER — CLINDAMYCIN HCL 150 MG PO CAPS: 300.0000 mg | ORAL_CAPSULE | Freq: Three times a day (TID) | ORAL | 0 refills | Status: DC

## 2022-06-18 MED ORDER — BENZONATATE 100 MG PO CAPS: 100.0000 mg | ORAL_CAPSULE | Freq: Three times a day (TID) | ORAL | 0 refills | Status: DC

## 2022-06-18 MED ORDER — MOLNUPIRAVIR EUA 200MG CAPSULE: 4.0000 | ORAL_CAPSULE | Freq: Two times a day (BID) | ORAL | 0 refills | Status: AC

## 2022-06-18 NOTE — ED Triage Notes (Signed)
Pt states he started having chills and hot spells, decreased appetite, cough, sore throat, runny nose, body aches since yesterday.   Family has been sick as well.

## 2022-06-18 NOTE — ED Provider Notes (Signed)
  [facility] EMERGENCY DEPARTMENT Provider Note   CSN: [id number] Arrival date & time: 06/18/22  4128     History {Add pertinent medical, surgical, social history, OB history to HPI:1} Chief Complaint  Patient presents with   URI    Seth Carroll is a 36 y.o. male.  HPI     36 year old male with history of HIV who presents with chills, decreased appetite, cough, sore throat, body aches.  Family has also been sick. Last CD4 WNL.  Past Medical History:  Diagnosis Date   Abscess of neck 02/2015   HIV (human immunodeficiency virus infection) (HCC)     Home Medications Prior to Admission medications   Medication Sig Start Date End Date Taking? Authorizing Provider  hydrochlorothiazide (HYDRODIURIL) 25 MG tablet Take 1 tablet by mouth daily. 06/05/22  Yes [provider]  traZODone (DESYREL) 50 MG tablet Take by mouth. 06/05/22  Yes [provider]  BIKTARVY 50-200-25 MG TABS tablet Take 1 tablet by mouth daily. 05/08/21   [provider]      Allergies    Penicillins    Review of Systems   Review of Systems  Physical Exam Updated Vital Signs BP 130/88   Pulse (!) 104   Temp 98.5 F (36.9 C) (Oral)   Resp 20   Ht 6\' 1"  (1.854 m)   Wt 113.4 kg   SpO2 99%   BMI 32.98 kg/m  Physical Exam  ED Results / Procedures / Treatments   Labs (all labs ordered are listed, but only abnormal results are displayed) Labs Reviewed  RESP PANEL BY RT-PCR (FLU A&B, COVID) ARPGX2  GROUP A STREP BY PCR    EKG None  Radiology No results found.  Procedures Procedures  {Document cardiac monitor, telemetry assessment procedure when appropriate:1}  Medications Ordered in ED Medications - No data to display  ED Course/ Medical Decision Making/ A&P                           Medical Decision Making  ***  {Document critical care time when appropriate:1} {Document review of labs and clinical decision tools ie heart score, Chads2Vasc2  etc:1}  {Document your independent review of radiology images, and any outside records:1} {Document your discussion with family members, caretakers, and with consultants:1} {Document social determinants of health affecting pt's care:1} {Document your decision making why or why not admission, treatments were needed:1} Final Clinical Impression(s) / ED Diagnoses Final diagnoses:  None    Rx / DC Orders ED Discharge Orders     None

## 2022-09-01 ENCOUNTER — Other Ambulatory Visit: Payer: Self-pay

## 2022-09-01 ENCOUNTER — Emergency Department (HOSPITAL_BASED_OUTPATIENT_CLINIC_OR_DEPARTMENT_OTHER)
Admission: EM | Admit: 2022-09-01 | Discharge: 2022-09-01 | Disposition: A | Discharge status: Home / Self Care | Payer: 59 | Attending: Emergency Medicine | Admitting: Emergency Medicine

## 2022-09-01 DIAGNOSIS — L249 Irritant contact dermatitis, unspecified cause: Secondary | ICD-10-CM | POA: Insufficient documentation

## 2022-09-01 DIAGNOSIS — R21 Rash and other nonspecific skin eruption: Secondary | ICD-10-CM | POA: Diagnosis present

## 2022-09-01 MED ORDER — MUPIROCIN 2 % EX OINT: 1.0000 | TOPICAL_OINTMENT | Freq: Four times a day (QID) | CUTANEOUS | 0 refills | Status: AC

## 2022-09-01 MED ORDER — DOXYCYCLINE HYCLATE 100 MG PO CAPS: 100.0000 mg | ORAL_CAPSULE | Freq: Two times a day (BID) | ORAL | 0 refills | Status: AC

## 2022-09-01 NOTE — Discharge Instructions (Signed)
I am writing a prescription for an antibiotic called mupirocin.  Please apply this every 6 hours for the next 5 days.  If on Wednesday your rash appears worse/more red but it is currently hemostatic taking oral antibiotics and if you develop any streaking up your arm, fevers or any other new or concerning symptoms come back to the emergency room immediately.

## 2022-09-01 NOTE — ED Triage Notes (Signed)
Pt presents with ongoing off and on oozing rash to right AC area.  Pt states usually it gets better but keeps coming back. This time it has been worse.  Does itch.  Only significant medical hx pt reports is HIV

## 2022-09-01 NOTE — ED Notes (Signed)
Pt has a rash on his right arm that he reports comes and goes over the years.  He sometimes scratches it as it itches and gets worse.

## 2022-09-01 NOTE — ED Provider Notes (Signed)
[place] [facility] Provider Note   CSN: [id number] Arrival date & time: 09/01/22  2114     History  Chief Complaint  Patient presents with   Skin Problem    Seth Carroll is a 37 y.o. male.  HPI  Patient is a 37 year old male present emergency room today with complaints of right arm rash he states that over the past 4 to 5 years he has had intermittent episodes  Patient does have a history of HIV however he he states he follows with [facility] infectious disease and is on [place] he is compliant with this he has normal CD4 count and undetectable viral load per patient.  Patient states he has not had any systemic symptoms such as nausea vomiting fatigue weakness fevers chills  He states that over the past week he has developed rash on right upper extremity and the elbow crease he states it is itchy and uncomfortable and seems to be getting worse.      Home Medications Prior to Admission medications   Medication Sig Start Date End Date Taking? Authorizing Provider  doxycycline (VIBRAMYCIN) 100 MG capsule Take 1 capsule (100 mg total) by mouth 2 (two) times daily for 7 days. 09/01/22 09/08/22 Yes Emsley Custer, Kathleene Hazel, PA  mupirocin ointment (BACTROBAN) 2 % Apply 1 Application topically every 6 (six) hours for 7 days. 09/01/22 09/08/22 Yes Kamrin Spath S, PA  benzonatate (TESSALON) 100 MG capsule Take 1 capsule (100 mg total) by mouth every 8 (eight) hours. 06/18/22   Gareth Morgan, MD  BIKTARVY 50-200-25 MG TABS tablet Take 1 tablet by mouth daily. 05/08/21   [provider]  hydrochlorothiazide (HYDRODIURIL) 25 MG tablet Take 1 tablet by mouth daily. 06/05/22   [provider]  ondansetron (ZOFRAN-ODT) 4 MG disintegrating tablet Take 1 tablet (4 mg total) by mouth every 8 (eight) hours as needed for nausea or vomiting. 06/18/22   Gareth Morgan, MD  traZODone (DESYREL) 50 MG tablet Take by mouth. 06/05/22   [provider]      Allergies    Penicillins    Review of Systems   Review of Systems  Physical Exam Updated Vital Signs BP (!) 140/87   Pulse 80   Temp 98.1 F (36.7 C)   Resp 18   SpO2 99%  Physical Exam Vitals and nursing note reviewed.  Constitutional:      General: He is not in acute distress.    Appearance: Normal appearance. He is not ill-appearing.  HENT:     Head: Normocephalic and atraumatic.  Eyes:     General: No scleral icterus.       Right eye: No discharge.        Left eye: No discharge.     Conjunctiva/sclera: Conjunctivae normal.  Pulmonary:     Effort: Pulmonary effort is normal.     Breath sounds: No stridor.  Musculoskeletal:     Comments: Full range of motion of the right elbow   Rashes in right AC  Skin:    General: Skin is warm and dry.     Comments: Slightly erythematous curvilinear area of vesicles with erythematous base.  Not tender to touch, blanching, no lymphangitis.  This patch of rash is approximately 4 cm total  No axillary lymphadenopathy  Neurological:     Mental Status: He is alert and oriented to person, place, and time. Mental status is at baseline.     ED Results / Procedures / Treatments  Labs (all labs ordered are listed, but only abnormal results are displayed) Labs Reviewed - No data to display  EKG None  Radiology No results found.  Procedures Procedures    Medications Ordered in ED Medications - No data to display  ED Course/ Medical Decision Making/ A&P                             Medical Decision Making  Patient is a 37 year old male present emergency room today with complaints of right arm rash he states that over the past 4 to 5 years he has had intermittent episodes  Patient does have a history of HIV however he he states he follows with [facility] infectious disease and is on [place] he is compliant with this he has normal CD4 count and undetectable viral load per patient.  Patient states he  has not had any systemic symptoms such as nausea vomiting fatigue weakness fevers chills  He states that over the past week he has developed rash on right upper extremity and the elbow crease he states it is itchy and uncomfortable and seems to be getting worse.  Patient with reassuring exam does not see any lymphangitis  Symptoms/presentation is consistent with questionably perhaps impetigo versus contact dermatitis such as poison oak or poison ivy.  Seems as the same area has recurrent lesions raising the question of some sort of viral etiology.  Recommend follow-up closely with his infectious disease doctor.  Mupirocin empirically with doxycycline to start in 3 days if his rash is not improving any worsening to prompt him to return emergency room in greater than 3 days   Final Clinical Impression(s) / ED Diagnoses Final diagnoses:  Irritant dermatitis    Rx / DC Orders ED Discharge Orders          Ordered    mupirocin ointment (BACTROBAN) 2 %  Every 6 hours        09/01/22 2152    doxycycline (VIBRAMYCIN) 100 MG capsule  2 times daily        09/01/22 2152              Tedd Sias, [place] 09/01/22 2159    Drenda Freeze, MD 09/01/22 2328

## 2024-05-19 ENCOUNTER — Other Ambulatory Visit: Payer: Self-pay

## 2024-05-19 ENCOUNTER — Emergency Department (HOSPITAL_BASED_OUTPATIENT_CLINIC_OR_DEPARTMENT_OTHER)
Admission: EM | Admit: 2024-05-19 | Discharge: 2024-05-20 | Disposition: A | Payer: Self-pay | Attending: Emergency Medicine | Admitting: Emergency Medicine

## 2024-05-19 DIAGNOSIS — R0981 Nasal congestion: Secondary | ICD-10-CM

## 2024-05-19 DIAGNOSIS — M545 Low back pain, unspecified: Secondary | ICD-10-CM | POA: Insufficient documentation

## 2024-05-19 DIAGNOSIS — X500XXA Overexertion from strenuous movement or load, initial encounter: Secondary | ICD-10-CM | POA: Insufficient documentation

## 2024-05-19 DIAGNOSIS — Z21 Asymptomatic human immunodeficiency virus [HIV] infection status: Secondary | ICD-10-CM | POA: Insufficient documentation

## 2024-05-19 MED ORDER — FLUTICASONE PROPIONATE 50 MCG/ACT NA SUSP: 2.0000 | Freq: Every day | NASAL | 0 refills | Status: AC

## 2024-05-19 MED ORDER — CYCLOBENZAPRINE HCL 10 MG PO TABS: 10.0000 mg | ORAL_TABLET | Freq: Three times a day (TID) | ORAL | 0 refills | Status: AC | PRN

## 2024-05-19 MED ORDER — NAPROXEN 375 MG PO TABS: 375.0000 mg | ORAL_TABLET | Freq: Two times a day (BID) | ORAL | 0 refills | Status: AC

## 2024-05-19 NOTE — Discharge Instructions (Signed)
 Begin taking naproxen as prescribed.  Begin taking Flexeril as prescribed as needed for pain not relieved with naproxen.  Begin using Flonase as prescribed.  Rest.  Follow-up with primary doctor if symptoms are not improving in the next week.

## 2024-05-19 NOTE — ED Triage Notes (Signed)
 Back pain x 3 days which is improved when standing..  Also reports sinus pain and pressure.

## 2024-05-19 NOTE — ED Provider Notes (Signed)
 [place] [facility]ENC[facility]MENT AT [facility] Provider Note   CSN: [id number] Arrival date & time: 05/19/24  2153     Patient presents with: Back Pain   Seth Carroll is a 38 y.o. male.   Patient is a 38 year old male with past medical history of HIV disease.  Patient presenting today with 2 complaints.  He reports back pain that started 2 days ago.  He reports moving a pool table over the weekend and his back has been hurting since.  He has pain to his left lower lumbar region with no radiation into his leg.  He denies any numbness or weakness.  No bowel or bladder complaints.  Pain is worse with bending and twisting with no alleviating factors.  He also describes sinus congestion.  He reports a history of allergies and typically gets this when the weather changes.  He states he usually is prescribed Flonase in the fall and is requesting a prescription for this.  He denies any fevers or chills.  No cough.       Prior to Admission medications   Medication Sig Start Date End Date Taking? Authorizing Provider  benzonatate  (TESSALON ) 100 MG capsule Take 1 capsule (100 mg total) by mouth every 8 (eight) hours. 06/18/22   Dreama Longs, MD  BIKTARVY 50-200-25 MG TABS tablet Take 1 tablet by mouth daily. 05/08/21   [provider]  hydrochlorothiazide (HYDRODIURIL) 25 MG tablet Take 1 tablet by mouth daily. 06/05/22   [provider]  ondansetron  (ZOFRAN -ODT) 4 MG disintegrating tablet Take 1 tablet (4 mg total) by mouth every 8 (eight) hours as needed for nausea or vomiting. 06/18/22   Dreama Longs, MD  traZODone (DESYREL) 50 MG tablet Take by mouth. 06/05/22   [provider]    Allergies: Penicillins    Review of Systems  All other systems reviewed and are negative.   Updated Vital Signs BP (!) 151/92 (BP Location: Right Arm)   Pulse 94   Temp 98.2 F (36.8 C) (Oral)   Resp 17   Ht 6' 1 (1.854 m)   Wt 90.7 kg   SpO2 98%   BMI  26.39 kg/m   Physical Exam Vitals and nursing note reviewed.  Constitutional:      Appearance: Normal appearance.  HENT:     Head: Normocephalic.     Mouth/Throat:     Mouth: Mucous membranes are moist.     Pharynx: No oropharyngeal exudate or posterior oropharyngeal erythema.  Pulmonary:     Effort: Pulmonary effort is normal.  Musculoskeletal:     Cervical back: Normal range of motion and neck supple.     Comments: There is mild tenderness in the left lower lumbar region.  There is no bony tenderness or step-off.  Skin:    General: Skin is warm and dry.  Neurological:     Mental Status: He is alert and oriented to person, place, and time.     Comments: DTRs are 2+ and symmetrical in the patellar and Achilles tendons bilaterally.  Strength is 5 out of 5 in both lower extremities.  He is able to ambulate without difficulty.     (all labs ordered are listed, but only abnormal results are displayed) Labs Reviewed - No data to display  EKG: None  Radiology: No results found.   Procedures   Medications Ordered in the ED - No data to display  Medical Decision Making  Patient presenting with back pain as described in the HPI.  He has symmetrical strength and reflexes and no complaints of bowel or bladder issues.  There are no red flags that would suggest an emergent situation and I feel as though patient can be discharged with NSAIDs and muscle relaxers.  This does sound like a musculoskeletal pain.  I will also prescribe Flonase for his nasal congestion.  This sounds like seasonal allergies as he has had this in the past.     Final diagnoses:  None    ED Discharge Orders     None          Geroldine Berg, MD 05/19/24 2355
